# Patient Record
Sex: Female | Born: 1965 | Race: Black or African American | Hispanic: No | Marital: Married | Smoking: Never smoker
Health system: Southern US, Community
[De-identification: ages and names within clinical notes are randomized; demographics above are authoritative.]

## PROBLEM LIST (undated history)

## (undated) DIAGNOSIS — E119 Type 2 diabetes mellitus without complications: Secondary | ICD-10-CM

## (undated) DIAGNOSIS — J9610 Chronic respiratory failure, unspecified whether with hypoxia or hypercapnia: Secondary | ICD-10-CM

## (undated) DIAGNOSIS — I10 Essential (primary) hypertension: Secondary | ICD-10-CM

## (undated) DIAGNOSIS — C9210 Chronic myeloid leukemia, BCR/ABL-positive, not having achieved remission: Secondary | ICD-10-CM

## (undated) DIAGNOSIS — E669 Obesity, unspecified: Secondary | ICD-10-CM

## (undated) DIAGNOSIS — R011 Cardiac murmur, unspecified: Secondary | ICD-10-CM

## (undated) DIAGNOSIS — I5032 Chronic diastolic (congestive) heart failure: Secondary | ICD-10-CM

## (undated) DIAGNOSIS — G4733 Obstructive sleep apnea (adult) (pediatric): Secondary | ICD-10-CM

## (undated) HISTORY — DX: Chronic respiratory failure, unspecified whether with hypoxia or hypercapnia: J96.10

## (undated) HISTORY — DX: Type 2 diabetes mellitus without complications: E11.9

## (undated) HISTORY — PX: PARTIAL HYSTERECTOMY: SHX80

## (undated) HISTORY — DX: Cardiac murmur, unspecified: R01.1

## (undated) HISTORY — PX: ABDOMINAL HYSTERECTOMY: SHX81

---

## 2005-08-07 ENCOUNTER — Inpatient Hospital Stay: Payer: Self-pay | Admitting: Internal Medicine

## 2005-08-07 ENCOUNTER — Other Ambulatory Visit: Payer: Self-pay

## 2005-08-08 ENCOUNTER — Other Ambulatory Visit: Payer: Self-pay

## 2006-05-09 ENCOUNTER — Other Ambulatory Visit: Payer: Self-pay

## 2006-05-10 ENCOUNTER — Inpatient Hospital Stay: Payer: Self-pay | Admitting: Internal Medicine

## 2006-09-12 ENCOUNTER — Emergency Department: Payer: Self-pay | Admitting: Emergency Medicine

## 2007-07-20 ENCOUNTER — Ambulatory Visit: Payer: Self-pay | Admitting: Family Medicine

## 2010-04-15 ENCOUNTER — Ambulatory Visit: Payer: Self-pay | Admitting: Internal Medicine

## 2011-02-05 ENCOUNTER — Ambulatory Visit: Payer: Self-pay | Admitting: Oncology

## 2011-02-12 ENCOUNTER — Ambulatory Visit: Payer: Self-pay | Admitting: Oncology

## 2011-02-12 LAB — COMPREHENSIVE METABOLIC PANEL
Anion Gap: 10 (ref 7–16)
Calcium, Total: 9.1 mg/dL (ref 8.5–10.1)
Chloride: 103 mmol/L (ref 98–107)
EGFR (African American): >60
Glucose: 101 mg/dL — ABNORMAL HIGH (ref 65–99)
Osmolality: 282 (ref 275–301)
Potassium: 3.7 mmol/L (ref 3.5–5.1)
SGOT(AST): 21 U/L (ref 15–37)
SGPT (ALT): 31 U/L

## 2011-02-12 LAB — CBC CANCER CENTER
Basophil %: 0.4 %
Eosinophil #: 0.3 x10 3/mm (ref 0.0–0.7)
HCT: 36.3 % (ref 35.0–47.0)
Lymphocyte %: 25.7 %
MCHC: 33.7 g/dL (ref 32.0–36.0)
MCV: 82 fL (ref 80–100)
Monocyte #: 0.9 x10 3/mm — ABNORMAL HIGH (ref 0.0–0.7)
Neutrophil #: 5.2 x10 3/mm (ref 1.4–6.5)
Platelet: 165 x10 3/mm (ref 150–440)
RDW: 15.1 % — ABNORMAL HIGH (ref 11.5–14.5)
WBC: 8.7 x10 3/mm (ref 3.6–11.0)

## 2011-03-12 ENCOUNTER — Ambulatory Visit: Payer: Self-pay | Admitting: Oncology

## 2011-04-30 ENCOUNTER — Ambulatory Visit: Payer: Self-pay | Admitting: Internal Medicine

## 2011-06-03 ENCOUNTER — Ambulatory Visit: Payer: Self-pay | Admitting: Oncology

## 2011-06-03 LAB — COMPREHENSIVE METABOLIC PANEL
Albumin: 3.6 g/dL (ref 3.4–5.0)
BUN: 15 mg/dL (ref 7–18)
Calcium, Total: 8.9 mg/dL (ref 8.5–10.1)
Chloride: 102 mmol/L (ref 98–107)
Co2: 25 mmol/L (ref 21–32)
Creatinine: 0.83 mg/dL (ref 0.60–1.30)
EGFR (Non-African Amer.): >60
Osmolality: 282 (ref 275–301)
Potassium: 3.7 mmol/L (ref 3.5–5.1)
SGOT(AST): 16 U/L (ref 15–37)
SGPT (ALT): 27 U/L
Total Protein: 8.1 g/dL (ref 6.4–8.2)

## 2011-06-03 LAB — CBC CANCER CENTER
Eosinophil #: 0.6 x10 3/mm (ref 0.0–0.7)
Eosinophil %: 7 %
HCT: 36.2 % (ref 35.0–47.0)
Lymphocyte %: 30.2 %
MCH: 26 pg (ref 26.0–34.0)
MCHC: 31.8 g/dL — ABNORMAL LOW (ref 32.0–36.0)
MCV: 82 fL (ref 80–100)
Monocyte %: 11.7 %
Platelet: 165 x10 3/mm (ref 150–440)
RBC: 4.42 10*6/uL (ref 3.80–5.20)
WBC: 8.4 x10 3/mm (ref 3.6–11.0)

## 2011-06-09 ENCOUNTER — Ambulatory Visit: Payer: Self-pay | Admitting: Oncology

## 2011-06-24 ENCOUNTER — Ambulatory Visit: Payer: Self-pay | Admitting: Internal Medicine

## 2011-09-02 ENCOUNTER — Ambulatory Visit: Payer: Self-pay | Admitting: Oncology

## 2011-09-02 LAB — CBC CANCER CENTER
Basophil #: 0.1 x10 3/mm (ref 0.0–0.1)
Eosinophil %: 5.9 %
HCT: 35.7 % (ref 35.0–47.0)
HGB: 11.7 g/dL — ABNORMAL LOW (ref 12.0–16.0)
Lymphocyte #: 2.1 x10 3/mm (ref 1.0–3.6)
MCHC: 32.7 g/dL (ref 32.0–36.0)
MCV: 80 fL (ref 80–100)
Monocyte #: 1.1 x10 3/mm — ABNORMAL HIGH (ref 0.2–0.9)
Platelet: 149 x10 3/mm — ABNORMAL LOW (ref 150–440)
RDW: 14.4 % (ref 11.5–14.5)
WBC: 8.6 x10 3/mm (ref 3.6–11.0)

## 2011-09-09 ENCOUNTER — Ambulatory Visit: Payer: Self-pay | Admitting: Oncology

## 2011-12-02 ENCOUNTER — Ambulatory Visit: Payer: Self-pay | Admitting: Oncology

## 2011-12-02 LAB — CBC CANCER CENTER
Basophil #: 0.1 x10 3/mm (ref 0.0–0.1)
Eosinophil #: 0.5 x10 3/mm (ref 0.0–0.7)
HCT: 33.7 % — ABNORMAL LOW (ref 35.0–47.0)
HGB: 10.8 g/dL — ABNORMAL LOW (ref 12.0–16.0)
Lymphocyte %: 22.6 %
MCH: 25.5 pg — ABNORMAL LOW (ref 26.0–34.0)
MCHC: 31.9 g/dL — ABNORMAL LOW (ref 32.0–36.0)
Monocyte #: 1 x10 3/mm — ABNORMAL HIGH (ref 0.2–0.9)
Neutrophil %: 58.2 %
Platelet: 152 x10 3/mm (ref 150–440)
RDW: 16.6 % — ABNORMAL HIGH (ref 11.5–14.5)

## 2011-12-10 ENCOUNTER — Ambulatory Visit: Payer: Self-pay | Admitting: Oncology

## 2012-02-09 ENCOUNTER — Ambulatory Visit: Payer: Self-pay | Admitting: Oncology

## 2012-05-31 ENCOUNTER — Ambulatory Visit: Payer: Self-pay | Admitting: Oncology

## 2012-06-01 LAB — CBC CANCER CENTER
Basophil #: 0.1 x10 3/mm (ref 0.0–0.1)
Basophil %: 1.1 %
Eosinophil #: 0.4 x10 3/mm (ref 0.0–0.7)
Eosinophil %: 5.2 %
HCT: 35.4 % (ref 35.0–47.0)
MCHC: 32.5 g/dL (ref 32.0–36.0)
MCV: 80 fL (ref 80–100)
Neutrophil #: 4 x10 3/mm (ref 1.4–6.5)
Platelet: 153 x10 3/mm (ref 150–440)
RBC: 4.45 10*6/uL (ref 3.80–5.20)
RDW: 15.7 % — ABNORMAL HIGH (ref 11.5–14.5)
WBC: 7.7 x10 3/mm (ref 3.6–11.0)

## 2012-06-08 ENCOUNTER — Ambulatory Visit: Payer: Self-pay | Admitting: Oncology

## 2012-09-08 ENCOUNTER — Ambulatory Visit: Payer: Self-pay | Admitting: Oncology

## 2012-09-25 ENCOUNTER — Inpatient Hospital Stay: Payer: Self-pay | Admitting: Internal Medicine

## 2012-09-25 LAB — URINALYSIS, COMPLETE
Blood: NEGATIVE
Glucose,UR: >=500 mg/dL (ref 0–75)
Ketone: NEGATIVE
Leukocyte Esterase: NEGATIVE
Ph: 5 (ref 4.5–8.0)
Protein: NEGATIVE
RBC,UR: 2 /HPF (ref 0–5)
WBC UR: 2 /HPF (ref 0–5)

## 2012-09-25 LAB — CBC
HCT: 36 % (ref 35.0–47.0)
HGB: 12 g/dL (ref 12.0–16.0)
MCH: 25.4 pg — ABNORMAL LOW (ref 26.0–34.0)
MCHC: 33.4 g/dL (ref 32.0–36.0)
Platelet: 186 10*3/uL (ref 150–440)
RBC: 4.74 10*6/uL (ref 3.80–5.20)
WBC: 11.6 10*3/uL — ABNORMAL HIGH (ref 3.6–11.0)

## 2012-09-25 LAB — COMPREHENSIVE METABOLIC PANEL
Alkaline Phosphatase: 117 U/L (ref 50–136)
Anion Gap: 6 — ABNORMAL LOW (ref 7–16)
Bilirubin,Total: 1.1 mg/dL — ABNORMAL HIGH (ref 0.2–1.0)
Calcium, Total: 9.3 mg/dL (ref 8.5–10.1)
Co2: 26 mmol/L (ref 21–32)
Creatinine: 0.81 mg/dL (ref 0.60–1.30)
EGFR (African American): >60
EGFR (Non-African Amer.): >60
Glucose: 248 mg/dL — ABNORMAL HIGH (ref 65–99)
Osmolality: 278 (ref 275–301)
Potassium: 3.5 mmol/L (ref 3.5–5.1)
SGPT (ALT): 29 U/L (ref 12–78)
Total Protein: 8.1 g/dL (ref 6.4–8.2)

## 2012-09-26 LAB — LIPASE, BLOOD: Lipase: >10000 U/L — ABNORMAL HIGH (ref 73–393)

## 2012-09-26 LAB — HEMATOCRIT: HCT: 33.9 % — ABNORMAL LOW

## 2012-09-27 LAB — CBC WITH DIFFERENTIAL/PLATELET
Basophil #: 0.1 10*3/uL (ref 0.0–0.1)
Eosinophil %: 1.6 %
HCT: 34.8 % — ABNORMAL LOW (ref 35.0–47.0)
Lymphocyte #: 1.8 10*3/uL (ref 1.0–3.6)
MCH: 25.5 pg — ABNORMAL LOW (ref 26.0–34.0)
MCHC: 33.7 g/dL (ref 32.0–36.0)
MCV: 76 fL — ABNORMAL LOW (ref 80–100)
Monocyte %: 12.7 %
Neutrophil #: 8.8 10*3/uL — ABNORMAL HIGH (ref 1.4–6.5)
Neutrophil %: 71 %
Platelet: 187 10*3/uL (ref 150–440)
RBC: 4.6 10*6/uL (ref 3.80–5.20)
WBC: 12.4 10*3/uL — ABNORMAL HIGH (ref 3.6–11.0)

## 2012-09-27 LAB — COMPREHENSIVE METABOLIC PANEL
Albumin: 3.1 g/dL — ABNORMAL LOW (ref 3.4–5.0)
Alkaline Phosphatase: 103 U/L (ref 50–136)
Anion Gap: 10 (ref 7–16)
Bilirubin,Total: 0.9 mg/dL (ref 0.2–1.0)
Co2: 24 mmol/L (ref 21–32)
Creatinine: 0.71 mg/dL (ref 0.60–1.30)
EGFR (African American): >60
EGFR (Non-African Amer.): >60
Osmolality: 270 (ref 275–301)
Potassium: 3.5 mmol/L (ref 3.5–5.1)
SGOT(AST): 17 U/L (ref 15–37)
Sodium: 134 mmol/L — ABNORMAL LOW (ref 136–145)
Total Protein: 7.6 g/dL (ref 6.4–8.2)

## 2012-09-27 LAB — LIPID PANEL
Cholesterol: 165 mg/dL (ref 0–200)
HDL Cholesterol: 70 mg/dL — ABNORMAL HIGH (ref 40–60)
Ldl Cholesterol, Calc: 78 mg/dL (ref 0–100)
VLDL Cholesterol, Calc: 17 mg/dL (ref 5–40)

## 2012-09-27 LAB — LIPASE, BLOOD: Lipase: 2484 U/L — ABNORMAL HIGH (ref 73–393)

## 2012-09-28 LAB — CBC WITH DIFFERENTIAL/PLATELET
Basophil #: 0.1 10*3/uL (ref 0.0–0.1)
Basophil %: 0.5 %
Eosinophil #: 0.3 10*3/uL (ref 0.0–0.7)
Eosinophil %: 2.4 %
HGB: 11.3 g/dL — ABNORMAL LOW (ref 12.0–16.0)
Lymphocyte #: 1.8 10*3/uL (ref 1.0–3.6)
Lymphocyte %: 15.5 %
MCH: 25.6 pg — ABNORMAL LOW (ref 26.0–34.0)
MCV: 76 fL — ABNORMAL LOW (ref 80–100)
Monocyte %: 14.8 %
Neutrophil #: 7.7 10*3/uL — ABNORMAL HIGH (ref 1.4–6.5)
Platelet: 182 10*3/uL (ref 150–440)
RBC: 4.43 10*6/uL (ref 3.80–5.20)
WBC: 11.5 10*3/uL — ABNORMAL HIGH (ref 3.6–11.0)

## 2012-09-28 LAB — COMPREHENSIVE METABOLIC PANEL
Albumin: 2.7 g/dL — ABNORMAL LOW (ref 3.4–5.0)
Alkaline Phosphatase: 125 U/L (ref 50–136)
Anion Gap: 12 (ref 7–16)
Bilirubin,Total: 1.4 mg/dL — ABNORMAL HIGH (ref 0.2–1.0)
Calcium, Total: 9 mg/dL (ref 8.5–10.1)
Co2: 20 mmol/L — ABNORMAL LOW (ref 21–32)
EGFR (African American): >60
EGFR (Non-African Amer.): >60
Glucose: 133 mg/dL — ABNORMAL HIGH (ref 65–99)
Potassium: 3.3 mmol/L — ABNORMAL LOW (ref 3.5–5.1)
SGPT (ALT): 22 U/L (ref 12–78)

## 2012-09-29 ENCOUNTER — Ambulatory Visit: Payer: Self-pay | Admitting: Oncology

## 2012-09-29 LAB — URINALYSIS, COMPLETE
Bilirubin,UR: NEGATIVE
Glucose,UR: NEGATIVE mg/dL (ref 0–75)
Ph: 5 (ref 4.5–8.0)
Protein: 100
Squamous Epithelial: 1
WBC UR: 51 /HPF (ref 0–5)

## 2012-09-29 LAB — CBC WITH DIFFERENTIAL/PLATELET
Basophil #: 0.1 10*3/uL (ref 0.0–0.1)
Basophil %: 0.9 %
Eosinophil #: 0.3 10*3/uL (ref 0.0–0.7)
Eosinophil %: 3 %
HGB: 11.4 g/dL — ABNORMAL LOW (ref 12.0–16.0)
Lymphocyte %: 21.8 %
MCHC: 33.5 g/dL (ref 32.0–36.0)
Monocyte %: 15.7 %
Neutrophil %: 58.6 %
Platelet: 199 10*3/uL (ref 150–440)
RDW: 15.9 % — ABNORMAL HIGH (ref 11.5–14.5)
WBC: 10.4 10*3/uL (ref 3.6–11.0)

## 2012-09-29 LAB — COMPREHENSIVE METABOLIC PANEL
Albumin: 2.6 g/dL — ABNORMAL LOW (ref 3.4–5.0)
Anion Gap: 10 (ref 7–16)
BUN: 9 mg/dL (ref 7–18)
Bilirubin,Total: 0.9 mg/dL (ref 0.2–1.0)
Calcium, Total: 9.1 mg/dL (ref 8.5–10.1)
Chloride: 105 mmol/L (ref 98–107)
Co2: 21 mmol/L (ref 21–32)
Creatinine: 0.67 mg/dL (ref 0.60–1.30)
EGFR (Non-African Amer.): >60
Glucose: 155 mg/dL — ABNORMAL HIGH (ref 65–99)
Osmolality: 274 (ref 275–301)
Potassium: 3.5 mmol/L (ref 3.5–5.1)
SGPT (ALT): 22 U/L (ref 12–78)
Sodium: 136 mmol/L (ref 136–145)
Total Protein: 7.4 g/dL (ref 6.4–8.2)

## 2012-09-29 LAB — LIPASE, BLOOD: Lipase: 339 U/L (ref 73–393)

## 2012-10-09 ENCOUNTER — Ambulatory Visit: Payer: Self-pay | Admitting: Oncology

## 2012-10-17 ENCOUNTER — Ambulatory Visit: Payer: Self-pay | Admitting: Oncology

## 2012-10-17 LAB — COMPREHENSIVE METABOLIC PANEL
Albumin: 3.4 g/dL (ref 3.4–5.0)
Anion Gap: 10 (ref 7–16)
Bilirubin,Total: 0.4 mg/dL (ref 0.2–1.0)
Calcium, Total: 9.3 mg/dL (ref 8.5–10.1)
EGFR (African American): >60
EGFR (Non-African Amer.): >60
Glucose: 163 mg/dL — ABNORMAL HIGH (ref 65–99)
Osmolality: 283 (ref 275–301)
Potassium: 3.3 mmol/L — ABNORMAL LOW (ref 3.5–5.1)
SGOT(AST): 14 U/L — ABNORMAL LOW (ref 15–37)
SGPT (ALT): 23 U/L (ref 12–78)
Sodium: 140 mmol/L (ref 136–145)

## 2012-10-17 LAB — CBC CANCER CENTER
Eosinophil %: 5.7 %
HCT: 32.6 % — ABNORMAL LOW (ref 35.0–47.0)
HGB: 10.8 g/dL — ABNORMAL LOW (ref 12.0–16.0)
Lymphocyte %: 31.6 %
MCV: 77 fL — ABNORMAL LOW (ref 80–100)
Monocyte #: 0.8 x10 3/mm (ref 0.2–0.9)
Monocyte %: 11.1 %
Neutrophil #: 3.5 x10 3/mm (ref 1.4–6.5)
RBC: 4.21 10*6/uL (ref 3.80–5.20)
RDW: 16.5 % — ABNORMAL HIGH (ref 11.5–14.5)
WBC: 6.9 x10 3/mm (ref 3.6–11.0)

## 2012-10-30 ENCOUNTER — Emergency Department: Payer: Self-pay | Admitting: Emergency Medicine

## 2012-10-30 LAB — CBC WITH DIFFERENTIAL/PLATELET
Basophil #: 0.1 10*3/uL (ref 0.0–0.1)
Eosinophil #: 0.5 10*3/uL (ref 0.0–0.7)
Eosinophil %: 4.9 %
Lymphocyte %: 26.8 %
MCV: 77 fL — ABNORMAL LOW (ref 80–100)
Monocyte #: 1 x10 3/mm — ABNORMAL HIGH (ref 0.2–0.9)
Platelet: 193 10*3/uL (ref 150–440)
RBC: 4.64 10*6/uL (ref 3.80–5.20)
RDW: 16.4 % — ABNORMAL HIGH (ref 11.5–14.5)

## 2012-10-30 LAB — COMPREHENSIVE METABOLIC PANEL
Albumin: 3.7 g/dL (ref 3.4–5.0)
Anion Gap: 7 (ref 7–16)
BUN: 13 mg/dL (ref 7–18)
Bilirubin,Total: 0.5 mg/dL (ref 0.2–1.0)
Calcium, Total: 9.4 mg/dL (ref 8.5–10.1)
Creatinine: 0.85 mg/dL (ref 0.60–1.30)
EGFR (African American): >60
EGFR (Non-African Amer.): >60
Glucose: 117 mg/dL — ABNORMAL HIGH (ref 65–99)
Potassium: 3.5 mmol/L (ref 3.5–5.1)
SGOT(AST): 22 U/L (ref 15–37)

## 2012-11-08 ENCOUNTER — Ambulatory Visit: Payer: Self-pay | Admitting: Oncology

## 2012-11-21 ENCOUNTER — Ambulatory Visit: Payer: Self-pay | Admitting: Internal Medicine

## 2012-12-09 ENCOUNTER — Ambulatory Visit: Payer: Self-pay | Admitting: Oncology

## 2012-12-12 LAB — COMPREHENSIVE METABOLIC PANEL
Alkaline Phosphatase: 105 U/L (ref 50–136)
Chloride: 104 mmol/L (ref 98–107)
Co2: 25 mmol/L (ref 21–32)
EGFR (African American): >60
EGFR (Non-African Amer.): >60
Glucose: 149 mg/dL — ABNORMAL HIGH (ref 65–99)
Osmolality: 284 (ref 275–301)
Potassium: 3.6 mmol/L (ref 3.5–5.1)
SGOT(AST): 13 U/L — ABNORMAL LOW (ref 15–37)
SGPT (ALT): 24 U/L (ref 12–78)
Sodium: 140 mmol/L (ref 136–145)

## 2012-12-12 LAB — CBC CANCER CENTER
Basophil #: 0.1 x10 3/mm (ref 0.0–0.1)
Basophil %: 1.1 %
Eosinophil #: 0.4 x10 3/mm (ref 0.0–0.7)
Eosinophil %: 5.1 %
HCT: 33.2 % — ABNORMAL LOW (ref 35.0–47.0)
Lymphocyte #: 2.6 x10 3/mm (ref 1.0–3.6)
Lymphocyte %: 33.8 %
MCH: 24.3 pg — ABNORMAL LOW (ref 26.0–34.0)
MCHC: 31.8 g/dL — ABNORMAL LOW (ref 32.0–36.0)
MCV: 76 fL — ABNORMAL LOW (ref 80–100)
Monocyte %: 13.1 %
Neutrophil #: 3.5 x10 3/mm (ref 1.4–6.5)
Neutrophil %: 46.9 %
Platelet: 191 x10 3/mm (ref 150–440)
RBC: 4.34 10*6/uL (ref 3.80–5.20)
RDW: 15.9 % — ABNORMAL HIGH (ref 11.5–14.5)

## 2012-12-13 ENCOUNTER — Ambulatory Visit: Payer: Self-pay | Admitting: Internal Medicine

## 2012-12-19 ENCOUNTER — Ambulatory Visit: Payer: Self-pay | Admitting: Internal Medicine

## 2012-12-28 ENCOUNTER — Ambulatory Visit: Payer: Self-pay | Admitting: Internal Medicine

## 2013-01-08 ENCOUNTER — Ambulatory Visit: Payer: Self-pay | Admitting: Oncology

## 2013-03-12 ENCOUNTER — Ambulatory Visit: Payer: Self-pay | Admitting: Oncology

## 2013-03-20 LAB — COMPREHENSIVE METABOLIC PANEL
ANION GAP: 11 (ref 7–16)
Albumin: 3.8 g/dL (ref 3.4–5.0)
Alkaline Phosphatase: 100 U/L
BUN: 27 mg/dL — ABNORMAL HIGH (ref 7–18)
Bilirubin,Total: 0.3 mg/dL (ref 0.2–1.0)
CALCIUM: 8.4 mg/dL — AB (ref 8.5–10.1)
CHLORIDE: 104 mmol/L (ref 98–107)
Co2: 25 mmol/L (ref 21–32)
Creatinine: 1.49 mg/dL — ABNORMAL HIGH (ref 0.60–1.30)
EGFR (African American): 48 — ABNORMAL LOW
EGFR (Non-African Amer.): 42 — ABNORMAL LOW
Glucose: 117 mg/dL — ABNORMAL HIGH (ref 65–99)
Osmolality: 286 (ref 275–301)
Potassium: 3.8 mmol/L (ref 3.5–5.1)
SGOT(AST): 19 U/L (ref 15–37)
SGPT (ALT): 34 U/L (ref 12–78)
Sodium: 140 mmol/L (ref 136–145)
Total Protein: 7.5 g/dL (ref 6.4–8.2)

## 2013-03-20 LAB — CBC CANCER CENTER
Basophil #: 0 x10 3/mm (ref 0.0–0.1)
Basophil %: 0.5 %
EOS ABS: 0.4 x10 3/mm (ref 0.0–0.7)
EOS PCT: 5.3 %
HCT: 33.3 % — ABNORMAL LOW (ref 35.0–47.0)
HGB: 10.5 g/dL — ABNORMAL LOW (ref 12.0–16.0)
LYMPHS ABS: 1.9 x10 3/mm (ref 1.0–3.6)
LYMPHS PCT: 26.9 %
MCH: 24.6 pg — ABNORMAL LOW (ref 26.0–34.0)
MCHC: 31.5 g/dL — ABNORMAL LOW (ref 32.0–36.0)
MCV: 78 fL — ABNORMAL LOW (ref 80–100)
Monocyte #: 1.1 x10 3/mm — ABNORMAL HIGH (ref 0.2–0.9)
Monocyte %: 16.1 %
NEUTROS ABS: 3.7 x10 3/mm (ref 1.4–6.5)
Neutrophil %: 51.2 %
PLATELETS: 163 x10 3/mm (ref 150–440)
RBC: 4.27 10*6/uL (ref 3.80–5.20)
RDW: 17.7 % — AB (ref 11.5–14.5)
WBC: 7.1 x10 3/mm (ref 3.6–11.0)

## 2013-03-20 LAB — PHOSPHORUS: PHOSPHORUS: 3.8 mg/dL (ref 2.5–4.9)

## 2013-03-20 LAB — MAGNESIUM: MAGNESIUM: 2.2 mg/dL

## 2013-04-08 ENCOUNTER — Ambulatory Visit: Payer: Self-pay | Admitting: Oncology

## 2013-06-19 ENCOUNTER — Ambulatory Visit: Payer: Self-pay | Admitting: Oncology

## 2013-06-25 LAB — CBC CANCER CENTER
BASOS PCT: 0.6 %
Basophil #: 0 x10 3/mm (ref 0.0–0.1)
EOS ABS: 0.3 x10 3/mm (ref 0.0–0.7)
EOS PCT: 4.1 %
HCT: 32.4 % — ABNORMAL LOW (ref 35.0–47.0)
HGB: 10.4 g/dL — ABNORMAL LOW (ref 12.0–16.0)
LYMPHS PCT: 22.9 %
Lymphocyte #: 1.9 x10 3/mm (ref 1.0–3.6)
MCH: 26 pg (ref 26.0–34.0)
MCHC: 32.2 g/dL (ref 32.0–36.0)
MCV: 81 fL (ref 80–100)
Monocyte #: 0.9 x10 3/mm (ref 0.2–0.9)
Monocyte %: 10.3 %
NEUTROS PCT: 62.1 %
Neutrophil #: 5.1 x10 3/mm (ref 1.4–6.5)
Platelet: 219 x10 3/mm (ref 150–440)
RBC: 4.02 10*6/uL (ref 3.80–5.20)
RDW: 17.1 % — AB (ref 11.5–14.5)
WBC: 8.3 x10 3/mm (ref 3.6–11.0)

## 2013-06-25 LAB — COMPREHENSIVE METABOLIC PANEL
ALT: 34 U/L (ref 12–78)
Albumin: 3.6 g/dL (ref 3.4–5.0)
Alkaline Phosphatase: 111 U/L
Anion Gap: 12 (ref 7–16)
BUN: 16 mg/dL (ref 7–18)
Bilirubin,Total: 0.3 mg/dL (ref 0.2–1.0)
CREATININE: 1.21 mg/dL (ref 0.60–1.30)
Calcium, Total: 9 mg/dL (ref 8.5–10.1)
Chloride: 105 mmol/L (ref 98–107)
Co2: 24 mmol/L (ref 21–32)
EGFR (Non-African Amer.): 54 — ABNORMAL LOW
GLUCOSE: 139 mg/dL — AB (ref 65–99)
Osmolality: 285 (ref 275–301)
Potassium: 3.9 mmol/L (ref 3.5–5.1)
SGOT(AST): 15 U/L (ref 15–37)
Sodium: 141 mmol/L (ref 136–145)
Total Protein: 7.5 g/dL (ref 6.4–8.2)

## 2013-07-09 ENCOUNTER — Ambulatory Visit: Payer: Self-pay | Admitting: Oncology

## 2013-10-18 ENCOUNTER — Ambulatory Visit: Payer: Self-pay | Admitting: Oncology

## 2013-10-18 LAB — COMPREHENSIVE METABOLIC PANEL
ALBUMIN: 3.4 g/dL (ref 3.4–5.0)
ALT: 36 U/L
Alkaline Phosphatase: 128 U/L — ABNORMAL HIGH
Anion Gap: 11 (ref 7–16)
BUN: 16 mg/dL (ref 7–18)
Bilirubin,Total: 0.2 mg/dL (ref 0.2–1.0)
CALCIUM: 8.8 mg/dL (ref 8.5–10.1)
CO2: 22 mmol/L (ref 21–32)
CREATININE: 1.31 mg/dL — AB (ref 0.60–1.30)
Chloride: 104 mmol/L (ref 98–107)
EGFR (African American): 56 — ABNORMAL LOW
EGFR (Non-African Amer.): 49 — ABNORMAL LOW
Glucose: 109 mg/dL — ABNORMAL HIGH (ref 65–99)
Osmolality: 283 (ref 275–301)
Potassium: 3.6 mmol/L (ref 3.5–5.1)
SGOT(AST): 18 U/L (ref 15–37)
Sodium: 141 mmol/L (ref 136–145)
Total Protein: 7.5 g/dL (ref 6.4–8.2)

## 2013-10-18 LAB — CBC CANCER CENTER
BASOS PCT: 0.6 %
Basophil #: 0 x10 3/mm (ref 0.0–0.1)
EOS ABS: 0.3 x10 3/mm (ref 0.0–0.7)
Eosinophil %: 5 %
HCT: 35.1 % (ref 35.0–47.0)
HGB: 11.3 g/dL — ABNORMAL LOW (ref 12.0–16.0)
LYMPHS ABS: 1.3 x10 3/mm (ref 1.0–3.6)
Lymphocyte %: 22.1 %
MCH: 25.2 pg — ABNORMAL LOW (ref 26.0–34.0)
MCHC: 32.3 g/dL (ref 32.0–36.0)
MCV: 78 fL — AB (ref 80–100)
Monocyte #: 1.2 x10 3/mm — ABNORMAL HIGH (ref 0.2–0.9)
Monocyte %: 20.6 %
Neutrophil #: 2.9 x10 3/mm (ref 1.4–6.5)
Neutrophil %: 51.7 %
Platelet: 163 x10 3/mm (ref 150–440)
RBC: 4.5 10*6/uL (ref 3.80–5.20)
RDW: 16.1 % — AB (ref 11.5–14.5)
WBC: 5.7 x10 3/mm (ref 3.6–11.0)

## 2013-10-18 LAB — MAGNESIUM: Magnesium: 1.7 mg/dL — ABNORMAL LOW

## 2013-10-18 LAB — PHOSPHORUS: Phosphorus: 3.4 mg/dL (ref 2.5–4.9)

## 2013-11-08 ENCOUNTER — Ambulatory Visit: Payer: Self-pay | Admitting: Oncology

## 2013-12-25 ENCOUNTER — Ambulatory Visit: Payer: Self-pay | Admitting: Internal Medicine

## 2014-01-17 ENCOUNTER — Ambulatory Visit: Payer: Self-pay | Admitting: Oncology

## 2014-01-17 LAB — COMPREHENSIVE METABOLIC PANEL
ALBUMIN: 3.7 g/dL (ref 3.4–5.0)
AST: 18 U/L (ref 15–37)
Alkaline Phosphatase: 114 U/L
Anion Gap: 10 (ref 7–16)
BUN: 14 mg/dL (ref 7–18)
Bilirubin,Total: 0.4 mg/dL (ref 0.2–1.0)
CHLORIDE: 104 mmol/L (ref 98–107)
CO2: 26 mmol/L (ref 21–32)
Calcium, Total: 9.1 mg/dL (ref 8.5–10.1)
Creatinine: 0.99 mg/dL (ref 0.60–1.30)
EGFR (Non-African Amer.): >60
Glucose: 101 mg/dL — ABNORMAL HIGH (ref 65–99)
Osmolality: 280 (ref 275–301)
POTASSIUM: 3.8 mmol/L (ref 3.5–5.1)
SGPT (ALT): 44 U/L
SODIUM: 140 mmol/L (ref 136–145)
TOTAL PROTEIN: 7.4 g/dL (ref 6.4–8.2)

## 2014-01-17 LAB — CBC CANCER CENTER
Basophil #: 0.1 x10 3/mm (ref 0.0–0.1)
Basophil %: 0.7 %
EOS ABS: 0.3 x10 3/mm (ref 0.0–0.7)
EOS PCT: 4.4 %
HCT: 37.4 % (ref 35.0–47.0)
HGB: 12.1 g/dL (ref 12.0–16.0)
LYMPHS ABS: 2.4 x10 3/mm (ref 1.0–3.6)
LYMPHS PCT: 32.8 %
MCH: 25.9 pg — ABNORMAL LOW (ref 26.0–34.0)
MCHC: 32.4 g/dL (ref 32.0–36.0)
MCV: 80 fL (ref 80–100)
Monocyte #: 1 x10 3/mm — ABNORMAL HIGH (ref 0.2–0.9)
Monocyte %: 13.3 %
Neutrophil #: 3.6 x10 3/mm (ref 1.4–6.5)
Neutrophil %: 48.8 %
PLATELETS: 164 x10 3/mm (ref 150–440)
RBC: 4.67 10*6/uL (ref 3.80–5.20)
RDW: 16.3 % — ABNORMAL HIGH (ref 11.5–14.5)
WBC: 7.4 x10 3/mm (ref 3.6–11.0)

## 2014-01-17 LAB — PHOSPHORUS: Phosphorus: 3.6 mg/dL (ref 2.5–4.9)

## 2014-01-17 LAB — MAGNESIUM: Magnesium: 1.8 mg/dL

## 2014-02-08 ENCOUNTER — Ambulatory Visit: Payer: Self-pay | Admitting: Oncology

## 2014-05-31 NOTE — Consult Note (Signed)
PATIENT NAME:  Cassidy Gates, Cassidy Gates MR#:  [id number] DATE OF BIRTH:  [birth year]  DATE OF CONSULTATION:  09/27/2012  REFERRING PHYSICIAN:  Wilfred Curtis, MD CONSULTING PHYSICIAN:  Andria Meuse, NP  PRIMARY CARE PHYSICIAN:  Augustina Mood, MD  ONCOLOGIST: Delight Hoh, MD  GASTROENTEROLOGIST: Lucilla Lame, MD  REASON FOR CONSULTATION: Acute pancreatitis.   HISTORY OF PRESENT ILLNESS: Cassidy Gates is a pleasant 49 year old black female who was admitted with pancreatitis. She reports a 2-day history of severe epigastric pain prior to admission. She describes the pain as a "gas pain and pressure." She has never had pancreatitis before. The pain is in her upper abdomen and radiates to her back.  It is currently 6 out of 10 on pain scale. The pain is constant but was worse with eating. She denies any alcohol use or illicit drug use. She has a history of hypercholesterolemia. She is on Tasigna for CML daily. She has been on this for the last 5 years. She had an episode of nausea and vomiting with a large amount of blood prior to admission. She describes the blood as bright red. She has had no further hematemesis. CT shows a mild pancreatitis. Her lipase was greater than 10,000 upon admission and is now 2484. Ultrasound did not show cholelithiasis, her common bile duct was 5.1 cm. She does have hepatomegaly with a 20.5 cm liver. Her white blood cell count is 12.4, hemoglobin 11.7. LFTs are normal except for an albumin of 3.1.   PAST MEDICAL AND SURGICAL HISTORY: CML followed by Dr. Grayland Ormond, diabetes mellitus type 2, hypertension, cardiomyopathy with congestive heart failure, hypercholesterolemia, hysterectomy with unilateral salpingo-oophorectomy.   MEDICATIONS PRIOR TO ADMISSION: Cyclobenzaprine 10 mg t.i.d., Diovan/HCT 160 mg/25 mg daily, lovastatin 40 mg daily, metformin 500 mg daily, metoprolol 25 mg b.i.d., montelukast 10 mg at bedtime, ranitidine 300 mg b.i.d., Tasigna 150 mg 2 capsules every 12 hours,  zolpidem 10 mg at bedtime.   ALLERGIES: CODEINE AND PENICILLIN.   FAMILY HISTORY: There is no known family history of pancreatic problems, liver or chronic GI problems. Mother deceased with breast cancer. Father had history of hypertension as well as sister with hypertension.   SOCIAL HISTORY: She is disabled but she does work as a Regulatory affairs officer for elderly. She is married. She has 1 healthy daughter. She denies any tobacco, alcohol or illicit drug use.   REVIEW OF SYSTEMS: See HPI, otherwise negative 12-point review of systems.   PHYSICAL EXAMINATION VITAL SIGNS:  Temp 98.1, pulse 78, respirations 18, blood pressure 172/81, O2 sat 94% on room air.  GENERAL: She is a well-developed, obese black female who is alert, oriented, pleasant and cooperative, in no acute distress.  HEENT: Sclerae clear, anicteric. Conjunctivae pink. Oropharynx pink and moist without any lesions.  NECK: Supple without mass or thyromegaly.  CHEST: Heart regular rate and rhythm. Normal S1, S2, without murmurs, clicks, rubs or gallops.  LUNGS: Clear to auscultation bilaterally.  ABDOMEN: Protuberant with positive bowel sounds x 4. Moderately distended. She has significant tenderness to her upper abdomen on deep palpation. There is no rebound tenderness or guarding. No hepatosplenomegaly or mass, although exam is limited given patient's body habitus.  EXTREMITIES: Without edema bilaterally.  SKIN: Warm and dry without any rash or jaundice.  MUSCULOSKELETAL: Good equal strength and movement bilaterally.  NEUROLOGIC: Grossly intact.  PSYCHIATRIC: She is alert with normal mood and affect.   LABORATORY STUDIES: Glucose 174, sodium 134, otherwise normal met-7. Lipase 2484. LFTs normal except albumin 3.1.  White blood cell count of 12.4, hemoglobin 11.7, hematocrit 34.8, platelets 187.   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 There is no history of alcohol use or cholelithiasis. Pancreatitis has been linked to Tasigna, which is being held, also lovastatin and Singulair carry a small risk. We will check for hypertriglyceridemia. Hematemesis may be secondary to peptic ulcer disease, gastritis or Mallory-Weiss tear.   PLAN 1.  N.p.o.  2.  Aggressive IV fluids.  3.  B.i.d. PPI IV.  4.  Agree with holding Tasigna, lovastatin and Singulair.  5.  Supportive measures including antiemetics and pain control.  6.  EGD with Dr. Allen Norris tomorrow. I discussed the procedure including risks and benefits which  include but are not limited to bleeding, infection, perforation, drug reaction. She agrees to the plan and consent has been obtained.   I would like to thank you for allowing [place] to participate in the care of Cassidy Gates.  ____________________________ Andria Meuse, NP klj:cs D: 09/27/2012 14:17:00 ET T: 09/27/2012 14:57:20 ET JOB#: [id number]  cc: Andria Meuse, NP, <Dictator> [name], MD Andria Meuse FNP ELECTRONICALLY SIGNED 10/23/2012 13:08

## 2014-05-31 NOTE — Consult Note (Signed)
Brief Consult Note: Diagnosis: pancreatitis.   Patient was seen by consultant.   Consult note dictated.   Comments: Cassidy Gates is a 49 y/o black female with hx DM, hyperlipidemia, CML on Tasigna for 5 yrs, htn & cardiomyopathy who presents with mild pancreatitis & hematemesis.  No ETOH or cholelithiasis.  Pancreatitis has been linked to [place] which is being held.  Also lovastatin & singulair carry risk.  Will check for hypertriglyceridemia.  Hematemesis may be secondary to PUD, gastritis or Mallory-weiss tear.  Plan: 1) NPO 2) Aggressive IVFS 3) BID PPI IV 4) Agree with holding Tasigna, lovastatin & singulair 5) Supportive measures including antiemetics/pain control 6) EGD with Dr Allen Norris tomorrow  Thanks for consult.  Please see full dictated note. #[id number].  Electronic Signatures: Andria Meuse (NP)  (Signed 20-Aug-14 14:06)  Authored: Brief Consult Note   Last Updated: 20-Aug-14 14:06 by Andria Meuse (NP)

## 2014-05-31 NOTE — Consult Note (Signed)
History of Present Illness:  Reason for Consult CML on Tasigna, now with pancreatitis.   HPI   Patient last seen in clinic in April 2014.  She is been on 300 mg Tasinga twice per day for 4-5 years with minimal side effects.  Over the last 2-3 days, patient states she had increasing abdominal pain as well as nausea vomiting.  She also has had minimal PO intake.  She otherwise feels well.  She denies any neurologic complaints.  She has no recent fevers.  She denies any chest pain or shortness of breath.  She denies any constipation or diarrhea.  She has no urinary complaints.  Patient feels generally terrible, but offers no further specific complaints.Marland Kitchen  PFSH:  Additional Past Medical and Surgical History Cancer history: August 2002 initiated on [place] but discontinued secondary to fluid retention.  She subsequently went on to receive dasatinib, but this was discontinued secondary to pleural effusion requiring chest tube.  March 2006 bone marrow biopsy revealed no evidence of CML and cytogenetics were reported as normal.  Patient was initiated on Tasigna 424mlligrams BID in 2009, this was decreased to her current dose of 300 mg secondary to alopecia.  Past medical history: CML, hypertension, diabetes, CHF, hypercholesterolemia.  Past surgical history: Hysterectomy with 1 ovary removed.  Family history: Negative and noncontributory.  Social history: Patient denies tobacco or alcohol.   Review of Systems:  Performance Status (ECOG) 0   Review of Systems   As per HPI. Otherwise, 10 point system review was negative.  NURSING NOTES: **Vital Signs.:   19-Aug-14 16:49   Vital Signs Type: Routine   Temperature Temperature (F): 98.5   Celsius: 36.9   Temperature Source: oral   Pulse Pulse: 71   Respirations Respirations: 20   Systolic BP Systolic BP: 1132  Diastolic BP (mmHg) Diastolic BP (mmHg): 92   Mean BP: 117   Pulse Ox % Pulse Ox %: 97   Pulse Ox Activity Level: At rest    Oxygen Delivery: Room Air/ 21 %   Physical Exam:  Physical Exam General: Well-developed, well-nourished, no acute distress. Eyes: Pink conjunctiva, anicteric sclera. HEENT: Normocephalic, moist mucous membranes, clear oropharnyx. Lungs: Clear to auscultation bilaterally. Heart: Regular rate and rhythm. No rubs, murmurs, or gallops. Abdomen: Soft, moderately tender with palpation, nondistended, normoactive bowel sounds. Musculoskeletal: No edema, cyanosis, or clubbing. Neuro: Alert, answering all questions appropriately. Cranial nerves grossly intact. Skin: No rashes or petechiae noted. Psych: Normal affect. Lymphatics: No cervical, calvicular, axillary or inguinal LAD.    PCN: Unknown  Codeine: Unknown    cyclobenzaprine 10 mg oral tablet: 1 tab(s) orally 3 times a day, Status: Active, Quantity: 90, Refills: 2   Tasigna 150 mg oral capsule: 2 cap(s) orally every 12 hours, Status: Active, Quantity: 120, Refills: 6   ranitidine 300 mg oral capsule: 1 cap(s) orally 2 times a day, Status: Active, Quantity: 0, Refills: None   Metoprolol Tartrate 25 mg oral tablet: 1 tab(s) orally 2 times a day, Status: Active, Quantity: 0, Refills: None   metformin 500 mg oral tablet: 1 tab(s) orally once a day, Status: Active, Quantity: 0, Refills: None   lovastatin 40 mg oral tablet: 1 tab(s) orally once a day, Status: Active, Quantity: 0, Refills: None   zolpidem 10 mg oral tablet: 1 tab(s) orally once a day (at bedtime), Status: Active, Quantity: 0, Refills: None   montelukast 10 mg oral tablet: 1 tab(s) orally once a day (in the evening), Status: Active, Quantity: 0,  Refills: None   Diovan HCT 160 mg-25 mg oral tablet: 1 tab(s) orally once a day, Status: Active, Quantity: 0, Refills: None  Laboratory Results: Hepatic:  18-Aug-14 15:58   Bilirubin, Total  1.1  Alkaline Phosphatase 117  SGPT (ALT) 29  SGOT (AST) 21  Total Protein, Serum 8.1  Albumin, Serum 3.8  Routine Chem:   18-Aug-14 15:58   Lipase  > 10000 (Result(s) reported on 25 Sep 2012 at 08:11PM.)  Glucose, Serum  248  BUN 16  Creatinine (comp) 0.81  Sodium, Serum  134  Potassium, Serum 3.5  Chloride, Serum 102  CO2, Serum 26  Calcium (Total), Serum 9.3  Osmolality (calc) 278  eGFR (African American) >60  eGFR (Non-African American) >60 (eGFR values <74m/min/1.73 m2 may be an indication of chronic kidney disease (CKD). Calculated eGFR is useful in patients with stable renal function. The eGFR calculation will not be reliable in acutely ill patients when serum creatinine is changing rapidly. It is not useful in  patients on dialysis. The eGFR calculation may not be applicable to patients at the low and high extremes of body sizes, pregnant women, and vegetarians.)  Anion Gap  6  Routine UA:  18-Aug-14 15:58   Color (UA) Yellow  Clarity (UA) Clear  Glucose (UA) >=500  Bilirubin (UA) Negative  Ketones (UA) Negative  Specific Gravity (UA) 1.020  Blood (UA) Negative  pH (UA) 5.0  Protein (UA) Negative  Nitrite (UA) Negative  Leukocyte Esterase (UA) Negative (Result(s) reported on 25 Sep 2012 at 04:26PM.)  RBC (UA) 2 /HPF  WBC (UA) 2 /HPF  Bacteria (UA) 2+  Epithelial Cells (UA) 3 /HPF  Mucous (UA) PRESENT (Result(s) reported on 25 Sep 2012 at 04:26PM.)  Routine Hem:  18-Aug-14 15:58   Hematocrit (CBC) 36.0  WBC (CBC)  11.6  RBC (CBC) 4.74  Hemoglobin (CBC) 12.0  Platelet Count (CBC) 186 (Result(s) reported on 25 Sep 2012 at 04:25PM.)  MCV  76  MCH  25.4  MCHC 33.4  RDW  15.8   Assessment and Plan: Impression:   CML on Tasigna, now with pancreatitis. Plan:   1.  CML: Patient's most recent PCR for the BCR-ABL mutation was negative.  She has been on 300 mg Tasigna twice a day for at least 4-5 years.  Although Tasigna reports and 8-17% chance of an elevated lipase, it is not list pancreatitis as a side effect.  Also, it would be unusual to develop this after being stable on the  medication for 4-5 years.  Despite this, agree with holding to Tasigna until her acute symptoms resolve.  We can rechallenge upon discharge to ensure her symptoms do not recur.  Patient expressed understanding and was in agreement with this plan. Pancreatitis: Unclear etiology, agree with current treatment. consult, will follow.  CC Referral:  cc: Dr. HGinette Pitman  Electronic Signatures: FDelight Hoh(MD)  (Signed 19-Aug-14 17:46)  Authored: HISTORY OF PRESENT ILLNESS, PFSH, ROS, NURSING NOTES, PE, ALLERGIES, HOME MEDICATIONS, LABS, ASSESSMENT AND PLAN, CC Referring Physician   Last Updated: 19-Aug-14 17:46 by FDelight Hoh(MD)

## 2014-05-31 NOTE — Consult Note (Signed)
Patient's pancreatitis is improving, possible discharge today.  Continue to hold Tasigna until patient is evaluated in the [facility] on October 17, 2012 at 3 PM.  At this point, will rechallenge with her current dose of Tasigna which is 300 mg twice per day. consult.  Call with questions.  Electronic Signatures: Delight Hoh (MD)  (Signed on 22-Aug-14 09:12)  Authored  Last Updated: 22-Aug-14 09:12 by Delight Hoh (MD)

## 2014-05-31 NOTE — H&P (Signed)
PATIENT NAME:  Cassidy Gates, Cassidy Gates MR#:  [id number] DATE OF BIRTH:  11-28-1965  DATE OF ADMISSION:  09/25/2012  PRIMARY CARE PHYSICIAN: Dr. Geralyn Corwin.   ONCOLOGIST: Dr. Delight Hoh.   REFERRING PHYSICIAN: Sabino Snipes, PA. She is covered by [employer].    CHIEF COMPLAINT: Abdominal pain and vomiting.   HISTORY OF PRESENT ILLNESS: Cassidy Gates is a 49 year old African American female with history of chronic myelogenous leukemia (CML), diabetes mellitus type 2, hypertension and cardiomyopathy. Presented to the Emergency Room with a 2-day history of severe epigastric pain. In fact, it is involving most of the central abdominal area and radiating to the back. The pain is described as sharp pain, then changed to dull, then goes back to sharp again. The severity may reach 9 on a scale of 10. Associated with at least 3 episodes of vomiting, a couple of them mixed with some blood. She has no fever. No diarrhea. No melena or hematochezia. Evaluation here at the Emergency Department revealed evidence of acute pancreatitis manifested by significantly elevated lipase reaching 10,000. Her ultrasound of the abdomen was unremarkable except for hepatomegaly. The patient is now in the process to be admitted under the care of the hospitalist for further evaluation and management.   REVIEW OF SYSTEMS:   CONSTITUTIONAL: Denies any fever. No chills. No fatigue.  EYES: No blurring of vision. No double vision.  ENT: No hearing impairment. No sore throat. No dysphagia.  CARDIOVASCULAR: No chest pain. No shortness of breath. No syncope.  RESPIRATORY: No cough. No hemoptysis. No chest pain. No shortness of breath.  GASTROINTESTINAL: Reports abdominal pain as described above, associated with nausea and a few episodes of vomiting. Some of them had some red blood with the emesis. No melena or hematochezia.  GENITOURINARY: No dysuria. No frequency of urination.  MUSCULOSKELETAL: No joint pain or swelling. No muscular  pain or swelling.  INTEGUMENTARY: No skin rash. No ulcers.  NEUROLOGY: No focal weakness. No seizure activity. No headache.  PSYCHIATRY: No anxiety. No depression.  ENDOCRINE: No polyuria or polydipsia. No heat or cold intolerance.   PAST MEDICAL HISTORY: CML, diabetes mellitus type 2, systemic hypertension, cardiomyopathy and congestive heart failure. I could not find a previous echo in the last few years. This may be done on an outpatient. There was one ordered in 2007, but I cannot retrieve the results. The patient also has history of hypercholesterolemia.   PAST SURGICAL HISTORY: Hysterectomy and removal of 1 ovary.   FAMILY HISTORY: Her mother died at the age of 75 after struggling with breast cancer. Her father has hypertension. Has a sister who has hypertension.   SOCIAL HABITS: Nonsmoker. No history of alcohol or drug abuse.   SOCIAL HISTORY: She is married, living with her husband, and she works as a Actuary for elderly people.   ADMISSION MEDICATIONS: Tasigna 150 mg 2 capsules twice a day, zolpidem or Ambien 10 mg at bedtime, ranitidine 300 mg twice a day, montelukast or Singulair 10 mg once a day, metoprolol tartrate 25 mg twice a day, metformin 500 mg once a day, lovastatin 40 mg a day, Diovan with hydrochlorothiazide 160/25 once a day, cyclobenzaprine or Flexeril 10 mg 3 times a day.   ALLERGIES: PENICILLIN AND CODEINE. Both of them cause skin rash.   PHYSICAL EXAMINATION:  VITAL SIGNS: Blood pressure 196/90, respiratory rate 20, pulse 72, temperature 98.7, oxygen saturation 99%.  GENERAL APPEARANCE: This is a young female lying in bed in no acute distress.  HEAD AND  NECK: No pallor. No icterus. No cyanosis. She has lost all of her hair, and she has baldness. Ear examination revealed normal hearing, no discharge, no lesions. Examination of the nose showed no bleeding, no discharge, no ulcers. Oropharyngeal examination revealed normal lips and tongue. No oral thrush, no ulcers. Eye  examination revealed normal eyelids and conjunctivae. Pupils about 4 to 5 mm, round, equal and reactive to light. Neck is supple. Trachea at midline. No thyromegaly. No cervical lymphadenopathy. No masses.  HEART: Normal S1, S2. No S3, S4. No murmur. No gallop. No carotid bruits.  RESPIRATORY: Normal breathing pattern without use of accessory muscles. No rales. No wheezing.  ABDOMEN: Soft, mild to moderate tenderness nonlocalized throughout the abdomen upon deep palpation, but there is no rebound and no rigidity. No masses. No hepatosplenomegaly. She is obese. Although the ultrasound shows hepatomegaly.  SKIN: No ulcers. No subcutaneous nodules.  MUSCULOSKELETAL: No joint swelling. No clubbing.  NEUROLOGIC: Cranial nerves II through XII were intact. No focal motor deficit.  PSYCHIATRIC: The patient is alert, oriented x3. Mood and affect were normal.   LABORATORY FINDINGS AND RADIOLOGIC DATA: Ultrasound of the abdomen showed prominent liver, measures 20 cm, consistent with enlarged liver. Negative Murphy sign. Gallbladder wall is 1.6 mm. No gallstones. Her serum glucose 248, BUN 16, creatinine 0.8, sodium 134, potassium 3.5. Lipase more than 10,000. Bilirubin 1.1, AST 21, ALT 29, alkaline phosphatase 117. CBC showed white count of 11,000, hemoglobin 12, hematocrit 36, platelet count 186. Urinalysis unremarkable except for more than 500 glucose.   ASSESSMENT:  1. Acute pancreatitis. Etiology is unclear.  2. Hematemesis. This could be secondary to Mallory-Weiss tear after the initial vomiting. However, differential diagnosis will include esophagitis, gastritis, peptic ulcer disease, etc.  3. Chronic myelogenous leukemia (CML).  4. Diabetes mellitus type 2, uncontrolled.  5. Systemic hypertension.  6. History of cardiomyopathy.  7. History of right pleural effusion in 2008. It was exudative pleural effusion and required chest tube placement.  8. Obesity.   PLAN: Will admit the patient to the medical  floor. Keep n.p.o. except for the medications. IV fluid with normal saline. Follow up on the lipase level. Pain control. Zofran p.r.n. for nausea and vomiting. IV Protonix 40 mg twice a day. GI consultation. I ordered CAT scan of the abdomen since the ultrasound was negative. Therefore, I will hold the metformin temporarily for 48 hours. Place on Accu-Chek and sliding scale. I will consult Dr. Grayland Ormond. Meanwhile, I will hold Tasigna since it can cause pancreatitis. Similarily will hold Lovastatin and Singulair as both carry small possibility of inducing pancreatitis. Resume the metoprolol and Diovan and monitor blood pressure to ensure adequate control. Check hemoglobin q.8 hours x3 to ensure stability. For deep vein thrombosis prophylaxis, anticoagulation is contraindicated since she has hematemesis. Therefore, I will use TED stockings and compression.   Time spent in evaluating this patient took more than 1 hour.    ____________________________ Clovis Pu. Lenore Manner, MD amd:gb D: 09/25/2012 22:56:24 ET T: 09/25/2012 23:12:56 ET JOB#: [id number]  cc: Clovis Pu. Lenore Manner, MD, <Dictator> Ellin Saba MD ELECTRONICALLY SIGNED 09/26/2012 6:39

## 2014-05-31 NOTE — Discharge Summary (Signed)
PATIENT NAME:  Cassidy Gates, Cassidy Gates MR#:  [id number] DATE OF BIRTH:  May 16, 1965  DATE OF ADMISSION:  09/25/2012 DATE OF DISCHARGE:  09/29/2012  DISCHARGE DIAGNOSIS:  1.  Acute pancreatitis.  2.  Gastritis and duodenitis.  3.  Type 2 diabetes.  4.  Hypertension.  5.  Chronic myeloid leukemia.  6.  History of cardiomyopathy.  7.  Obesity.  8.  Urinary tract infection.   CHIEF COMPLAINT: Abdominal pain, vomiting.   HISTORY OF PRESENT ILLNESS: Fredricka Kohrs is a 49 year old African American female with history of CML, type 2 diabetes and cardiomyopathy. Presented to ER with a 2 day history of severe upper abdominal pain. She described it as a sharp pain and subsequently changed to a dull ache.  Her serum lipase was noted to greater than 10,000 on admission and ultrasound was unremarkable other than hepatomegaly. CT scan showed evidence of pancreatitis, but no evidence of gallstones.   PAST MEDICAL HISTORY: Significant for CML,  type 2 diabetes, hypertension, cardiomyopathy, CHF.    PAST SURGICAL HISTORY: Significant for hysterectomy and unilateral ovariectomy.   PHYSICAL EXAMINATION: VITAL SIGNS: Blood pressure 196/90, respirations 20, pulse 72, temperature 98.7, O2 sat 99% on room air.  HEENT: NCAT. HEART: S1, S2.  LUNGS: Clear.  ABDOMEN: Soft. Evidence of tenderness in the epigastric area with mild guarding. No rebound.  EXTREMITIES: No edema.  NEUROLOGIC: Nonfocal.   LABORATORY AND DIAGNOSTICS: Ultrasound of the abdomen showed prominent liver. Gallbladder wall was 1.6 mm.  No evidence of gallstones.   Glucose 248, BUN 16, creatinine 0.8, sodium 134, potassium 3.5, lipase greater than 10,000, bilirubin 1.1, AST 21, ALT 29, alk phos 117. CBC showed WBC count 11,000, hemoglobin 12, hematocrit 36 and platelets 186.   HOSPITAL COURSE: The patient was admitted to [facility] and received intravenous morphine for pain control. She was also seen in consultation by Dr. Grayland Ormond and Dr. Allen Norris, and she also  underwent an EGD which showed evidence for gastritis and hiatal hernia. The duodenum appeared to be normal. A medium size hiatal hernia was present. The patient was also started on IV Protonix. Symptomatically she felt better and her serum lipase also trended down gradually to normal levels and it came down to 339 on day of discharge. The patient also was noted to have a urinary tract infection for which she was treated with Cipro. Cultures were still pending. A CT scan of the abdomen showed normal appendix, atelectasis of the lung bases. Liver was normal. Spleen was normal. Pancreatic edema cannot be excluded. Bladder was nondistended. The patient's diet was gradually advanced, and she was stable at time of discharge. I discussed the case also with Dr. Grayland Ormond who felt that Tasigna could be held until her lipase is rechecked as an outpatient. She was discharged in stable condition.   DISCHARGE MEDICATIONS: 1.  Zolpidem 10 mg once a day. 2.  Metformin 500 mg once a day. 3.  Valsartan 320 mg once a day. 4.  Ciprofloxacin 500 mg q. 12 for 5 days. 5.  Metoprolol 25 mg b.i.d.  6.  Percocet 5/325 mg 1 tablet p.o. b.i.d. p.r.n.  7.  Cyclobenzaprine 10 mg p.o. q. 8 p.r.n.  8.  Omeprazole 20 mg p.o. b.i.d.   DISCHARGE INSTRUCTIONS:  She was advised low carb diet, low residue diet and gradually advanced to ADA diet. Follow up with me, Dr. [name], in 1 to 2 weeks. Also follow up with Dr. Grayland Ormond in 1 to 2 weeks' time. The patient has been advised  to report to the ER if she has worsening abdominal pain at any time.    Total time spent in discharge of patient: 35 minutes ____________________________ [name], MD vh:sb D: 09/29/2012 13:06:37 ET T: 09/29/2012 13:37:42 ET JOB#: [id number]  cc: [name], MD, <Dictator> [name] MD ELECTRONICALLY SIGNED 10/12/2012 17:37

## 2014-05-31 NOTE — Consult Note (Signed)
Chief Complaint:  Subjective/Chief Complaint Denies abdomina pain, nausea or vomiting.  Tolerating regular diet well.  UTI on cipro.   VITAL SIGNS/ANCILLARY NOTES: **Vital Signs.:   22-Aug-14 11:54  Vital Signs Type Routine  Temperature Temperature (F) 98.2  Temperature Source oral  Pulse Pulse 110  Respirations Respirations 20  Systolic BP Systolic BP 785  Diastolic BP (mmHg) Diastolic BP (mmHg) 87  Mean BP 102  BP Source  if not from Vital Sign Device non-invasive  Pulse Ox % Pulse Ox % 92  Pulse Ox Activity Level  At rest  Oxygen Delivery Room Air/ 21 %   Brief Assessment:  GEN well developed, no acute distress, obese, A/Ox3. Husband at bedside.   Cardiac Regular   Respiratory normal resp effort   Gastrointestinal details normal Soft  Nontender  Nondistended  Bowel sounds normal  No rebound tenderness  No gaurding   EXTR positive edema, negative edema, trace PT bilat   Additional Physical Exam Skin: warm, dry   Lab Results: Hepatic:  22-Aug-14 06:55   Bilirubin, Total 0.9  Alkaline Phosphatase 115  SGPT (ALT) 22  SGOT (AST) 19  Total Protein, Serum 7.4  Albumin, Serum  2.6  Routine Chem:  22-Aug-14 06:55   Glucose, Serum  155  BUN 9  Creatinine (comp) 0.67  Sodium, Serum 136  Potassium, Serum 3.5  Chloride, Serum 105  CO2, Serum 21  Calcium (Total), Serum 9.1  Osmolality (calc) 274  eGFR (African American) >60  eGFR (Non-African American) >60 (eGFR values <79m/min/1.73 m2 may be an indication of chronic kidney disease (CKD). Calculated eGFR is useful in patients with stable renal function. The eGFR calculation will not be reliable in acutely ill patients when serum creatinine is changing rapidly. It is not useful in  patients on dialysis. The eGFR calculation may not be applicable to patients at the low and high extremes of body sizes, pregnant women, and vegetarians.)  Anion Gap 10  Lipase 339 (Result(s) reported on 29 Sep 2012 at 07:54AM.)   Routine UA:  22-Aug-14 08:50   Color (UA) Yellow  Clarity (UA) Hazy  Glucose (UA) Negative  Bilirubin (UA) Negative  Ketones (UA) 2+  Specific Gravity (UA) 1.016  Blood (UA) 3+  pH (UA) 5.0  Protein (UA) 100 mg/dL  Nitrite (UA) Negative  Leukocyte Esterase (UA) 2+ (Result(s) reported on 29 Sep 2012 at 09:11AM.)  RBC (UA) 114 /HPF  WBC (UA) 51 /HPF  Bacteria (UA) 3+  Epithelial Cells (UA) 1 /HPF  Mucous (UA) PRESENT (Result(s) reported on 29 Sep 2012 at 09:11AM.)  Routine Hem:  22-Aug-14 06:55   WBC (CBC) 10.4  RBC (CBC) 4.44  Hemoglobin (CBC)  11.4  Hematocrit (CBC)  34.0  Platelet Count (CBC) 199  MCV  77  MCH  25.6  MCHC 33.5  RDW  15.9  Neutrophil % 58.6  Lymphocyte % 21.8  Monocyte % 15.7  Eosinophil % 3.0  Basophil % 0.9  Neutrophil # 6.1  Lymphocyte # 2.3  Monocyte #  1.6  Eosinophil # 0.3  Basophil # 0.1 (Result(s) reported on 29 Sep 2012 at 07:58AM.)   Assessment/Plan:  Assessment/Plan:  Assessment Acute pancreatitis:  likely secondary to medications.  Resolved.  Tysigna on hold. UTI: on abx Gastritis   Plan 1) Complete cipro 2) PPI daily 3) Call if any further problems 4) FU OV w/ uKoreain 4 weeks   Electronic Signatures: JAndria Meuse(NP)  (Signed 22-Aug-14 13:33)  Authored: Chief Complaint, VITAL SIGNS/ANCILLARY NOTES,  Brief Assessment, Lab Results, Assessment/Plan   Last Updated: 22-Aug-14 13:33 by Andria Meuse (NP)

## 2014-08-11 ENCOUNTER — Other Ambulatory Visit
Admission: RE | Admit: 2014-08-11 | Discharge: 2014-08-11 | Disposition: A | Discharge status: Home / Self Care | Payer: Medicare Other | Source: Ambulatory Visit | Attending: Family Medicine | Admitting: Family Medicine

## 2014-08-11 DIAGNOSIS — R609 Edema, unspecified: Secondary | ICD-10-CM | POA: Insufficient documentation

## 2014-08-11 LAB — BRAIN NATRIURETIC PEPTIDE: B Natriuretic Peptide: 597 pg/mL — ABNORMAL HIGH (ref 0.0–100.0)

## 2014-08-20 ENCOUNTER — Inpatient Hospital Stay
Admission: EM | Admit: 2014-08-20 | Discharge: 2014-08-23 | DRG: 291 | Disposition: A | Discharge status: Home / Self Care | Payer: Medicare Other | Attending: Internal Medicine | Admitting: Internal Medicine

## 2014-08-20 ENCOUNTER — Emergency Department: Payer: Medicare Other

## 2014-08-20 ENCOUNTER — Encounter: Payer: Self-pay | Admitting: Emergency Medicine

## 2014-08-20 DIAGNOSIS — I10 Essential (primary) hypertension: Secondary | ICD-10-CM

## 2014-08-20 DIAGNOSIS — E119 Type 2 diabetes mellitus without complications: Secondary | ICD-10-CM | POA: Diagnosis present

## 2014-08-20 DIAGNOSIS — J9601 Acute respiratory failure with hypoxia: Secondary | ICD-10-CM | POA: Diagnosis present

## 2014-08-20 DIAGNOSIS — Z79899 Other long term (current) drug therapy: Secondary | ICD-10-CM

## 2014-08-20 DIAGNOSIS — I509 Heart failure, unspecified: Secondary | ICD-10-CM

## 2014-08-20 DIAGNOSIS — I5033 Acute on chronic diastolic (congestive) heart failure: Secondary | ICD-10-CM | POA: Diagnosis not present

## 2014-08-20 DIAGNOSIS — Z803 Family history of malignant neoplasm of breast: Secondary | ICD-10-CM

## 2014-08-20 DIAGNOSIS — N183 Chronic kidney disease, stage 3 (moderate): Secondary | ICD-10-CM | POA: Diagnosis present

## 2014-08-20 DIAGNOSIS — Z883 Allergy status to other anti-infective agents status: Secondary | ICD-10-CM

## 2014-08-20 DIAGNOSIS — Z6841 Body Mass Index (BMI) 40.0 and over, adult: Secondary | ICD-10-CM

## 2014-08-20 DIAGNOSIS — N179 Acute kidney failure, unspecified: Secondary | ICD-10-CM

## 2014-08-20 DIAGNOSIS — I129 Hypertensive chronic kidney disease with stage 1 through stage 4 chronic kidney disease, or unspecified chronic kidney disease: Secondary | ICD-10-CM | POA: Diagnosis present

## 2014-08-20 DIAGNOSIS — I5031 Acute diastolic (congestive) heart failure: Secondary | ICD-10-CM

## 2014-08-20 DIAGNOSIS — G4733 Obstructive sleep apnea (adult) (pediatric): Secondary | ICD-10-CM

## 2014-08-20 DIAGNOSIS — Z880 Allergy status to penicillin: Secondary | ICD-10-CM

## 2014-08-20 DIAGNOSIS — C9210 Chronic myeloid leukemia, BCR/ABL-positive, not having achieved remission: Secondary | ICD-10-CM

## 2014-08-20 DIAGNOSIS — E1159 Type 2 diabetes mellitus with other circulatory complications: Secondary | ICD-10-CM

## 2014-08-20 DIAGNOSIS — R0602 Shortness of breath: Secondary | ICD-10-CM

## 2014-08-20 HISTORY — DX: Obstructive sleep apnea (adult) (pediatric): G47.33

## 2014-08-20 HISTORY — DX: Chronic diastolic (congestive) heart failure: I50.32

## 2014-08-20 HISTORY — DX: Chronic myeloid leukemia, BCR/ABL-positive, not having achieved remission: C92.10

## 2014-08-20 HISTORY — DX: Essential (primary) hypertension: I10

## 2014-08-20 HISTORY — DX: Obesity, unspecified: E66.9

## 2014-08-20 LAB — COMPREHENSIVE METABOLIC PANEL
ALK PHOS: 72 U/L (ref 38–126)
ALT: 27 U/L (ref 14–54)
AST: 19 U/L (ref 15–41)
Albumin: 3.7 g/dL (ref 3.5–5.0)
Anion gap: 14 (ref 5–15)
BUN: 17 mg/dL (ref 6–20)
CALCIUM: 9 mg/dL (ref 8.9–10.3)
CO2: 20 mmol/L — AB (ref 22–32)
Chloride: 103 mmol/L (ref 101–111)
Creatinine, Ser: 1.24 mg/dL — ABNORMAL HIGH (ref 0.44–1.00)
GFR calc Af Amer: 59 mL/min — ABNORMAL LOW (ref >60)
GFR calc non Af Amer: 51 mL/min — ABNORMAL LOW (ref >60)
GLUCOSE: 118 mg/dL — AB (ref 65–99)
Potassium: 3.7 mmol/L (ref 3.5–5.1)
Sodium: 137 mmol/L (ref 135–145)
TOTAL PROTEIN: 7 g/dL (ref 6.5–8.1)
Total Bilirubin: 0.8 mg/dL (ref 0.3–1.2)

## 2014-08-20 LAB — CBC
HCT: 36.9 % (ref 35.0–47.0)
Hemoglobin: 11.9 g/dL — ABNORMAL LOW (ref 12.0–16.0)
MCH: 26.3 pg (ref 26.0–34.0)
MCHC: 32.2 g/dL (ref 32.0–36.0)
MCV: 81.5 fL (ref 80.0–100.0)
PLATELETS: 187 10*3/uL (ref 150–440)
RBC: 4.53 MIL/uL (ref 3.80–5.20)
RDW: 15.1 % — ABNORMAL HIGH (ref 11.5–14.5)
WBC: 7.6 10*3/uL (ref 3.6–11.0)

## 2014-08-20 LAB — BRAIN NATRIURETIC PEPTIDE: B Natriuretic Peptide: 1811 pg/mL — ABNORMAL HIGH (ref 0.0–100.0)

## 2014-08-20 LAB — TROPONIN I: Troponin I: <0.03 ng/mL (ref <0.031)

## 2014-08-20 MED ORDER — FUROSEMIDE 10 MG/ML IJ SOLN
60.0000 mg | Freq: Once | INTRAMUSCULAR | Status: AC
  Administered 2014-08-21: 60 mg via INTRAVENOUS
  Filled 2014-08-20: qty 8

## 2014-08-20 NOTE — ED Notes (Signed)
Charge RN aware of need for bed. 

## 2014-08-20 NOTE — ED Notes (Signed)
o2 saturation 92% on ra post ambulation

## 2014-08-20 NOTE — ED Notes (Signed)
Pt presents to ER alert and in NAD. 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 Pt states SOB with ambulation. Resp even and unlabored. Pt is pale.

## 2014-08-20 NOTE — ED Provider Notes (Signed)
[facility] Emergency Department Provider Note  Time seen: 11:12 PM  I have reviewed the triage vital signs and the nursing notes.   HISTORY  Chief Complaint Shortness of Breath    HPI Cassidy Gates is a 49 y.o. female with a past medical history of CML, hypertension, diabetes, CHF who presents the emergency department with shortness of breath. According to the patient for the past 2-3 months she has been increasingly short of breath, has also gained approximately 40 pounds in that duration. Patient states her shortness of breath has continued to worsen especially over the last 2-3 weeks. Her Lasix was increased by her doctor from 40 mg 3 times a day to 60 mg 3 times a day. When that failed and she continued to gain fluid they've placed her on torsemide 20 mg daily on 08/16/14. Patient states her shortness of breath has continued to worsen, now she has no longer able to ambulate more than a few feet without having to rest due to shortness of breath. Denies any chest pain. Patient states continues to accumulate fluid in her legs and abdomen. Given her worsening shortness of breath she came to the emergency department for evaluation. As her dyspnea as moderate at rest, severe with exertion. .    Past Medical History  Diagnosis Date  . Leukemia   . Hypertension   . Diabetes mellitus without complication   . CHF (congestive heart failure)     There are no active problems to display for this patient.   Past Surgical History  Procedure Laterality Date  . Partial hysterectomy      Current Outpatient Rx  Name  Route  Sig  Dispense  Refill  . diphenhydrAMINE (SOMINEX) 25 MG tablet   Oral   Take 25 mg by mouth at bedtime as needed for sleep.         Marland Kitchen docusate sodium (COLACE) 100 MG capsule   Oral   Take 100 mg by mouth daily as needed for mild constipation.         . ferrous sulfate 325 (65 FE) MG tablet   Oral   Take 325 mg by mouth daily with  breakfast.         . hydrALAZINE (APRESOLINE) 50 MG tablet   Oral   Take 50 mg by mouth 3 (three) times daily.         Marland Kitchen levocetirizine (XYZAL) 5 MG tablet   Oral   Take 5 mg by mouth every evening.         . lovastatin (MEVACOR) 40 MG tablet   Oral   Take 40 mg by mouth at bedtime.         . metFORMIN (GLUCOPHAGE) 500 MG tablet   Oral   Take by mouth 2 (two) times daily with a meal.         . metoprolol (LOPRESSOR) 100 MG tablet   Oral   Take 200 mg by mouth 1 day or 1 dose.         . montelukast (SINGULAIR) 10 MG tablet   Oral   Take 10 mg by mouth at bedtime.         . ranitidine (ZANTAC) 150 MG tablet   Oral   Take 150 mg by mouth 2 (two) times daily.         Marland Kitchen senna (SENOKOT) 8.6 MG TABS tablet   Oral   Take 1 tablet by mouth daily as needed for mild constipation.         Marland Kitchen  torsemide (DEMADEX) 20 MG tablet   Oral   Take 20 mg by mouth daily.         . valsartan (DIOVAN) 320 MG tablet   Oral   Take 320 mg by mouth daily.         . vitamin C (ASCORBIC ACID) 500 MG tablet   Oral   Take 1,000 mg by mouth daily.         Marland Kitchen zolpidem (AMBIEN) 10 MG tablet   Oral   Take 10 mg by mouth at bedtime as needed for sleep.         . bosutinib (BOSULIF) 500 MG tablet   Oral   Take 500 mg by mouth daily with breakfast. Take with food.           Allergies Codeine and Penicillins  History reviewed. No pertinent family history.  Social History History  Substance Use Topics  . Smoking status: Never Smoker   . Smokeless tobacco: Not on file  . Alcohol Use: No    Review of Systems Constitutional: Negative for fever. Cardiovascular: Negative for chest pain. Respiratory: Positive for shortness breath. Gastrointestinal: Negative for abdominal pain, vomiting and diarrhea. Musculoskeletal: Negative for back pain. Neurological: Negative for headache 10-point ROS otherwise  negative.  ____________________________________________   PHYSICAL EXAM:  VITAL SIGNS: ED Triage Vitals  Enc Vitals Group     BP 08/20/14 2029 160/93 mmHg     Pulse Rate 08/20/14 2029 83     Resp 08/20/14 2029 24     Temp 08/20/14 2029 98.1 F (36.7 C)     Temp Source 08/20/14 2029 Oral     SpO2 08/20/14 2029 93 %     Weight 08/20/14 2029 286 lb (129.729 kg)     Height 08/20/14 2029 5\' 5"  (1.651 m)     Head Cir --      Peak Flow --      Pain Score 08/20/14 2030 7     Pain Loc --      Pain Edu? --      Excl. in [place]? --     Constitutional: Alert and oriented. Well appearing and in no distress. ENT   Mouth/Throat: Mucous membranes are moist. Cardiovascular: Normal rate, regular rhythm. No murmur Respiratory: Normal respiratory effort without tachypnea nor retractions. Appears to have mild rales in bilateral lower lung fields. Gastrointestinal: Soft and nontender. No distention.  Musculoskeletal: Moderate lower extremity tenderness bilaterally, 2-3+ bilateral lower extremity edema. Appears equal bilaterally. Neurologic:  Normal speech and language. No gross focal neurologic deficits  Skin:  Skin is warm, dry and intact.  Psychiatric: Mood and affect are normal. Speech and behavior are normal.   ____________________________________________    EKG  EKG reviewed and interpreted by myself shows normal sinus rhythm at 83 bpm, narrow QRS, normal axis, normal intervals, nonspecific but no concerning ST changes noted. ____________________________________________    RADIOLOGY  Chest x-ray shows cardiomegaly with vascular congestion. Also small right pleural effusion.  ____________________________________________    INITIAL IMPRESSION / ASSESSMENT AND PLAN / ED COURSE  Pertinent labs & imaging results that were available during my care of the patient were reviewed by me and considered in my medical decision making (see chart for details).  Patient with worsening  shortness of breath despite home medication changes. Currently satting 90-92% on room air. Labs are largely within normal limits besides elevated BNP. We will ambulate the patient to evaluate oxygen saturation, and determine if the patient needs admission for  IV diuresis.  Patient continues to have a borderline oxygen saturation 88-89 percent in bed, minimal drop with ambulation, the patient felt extremely short of breath with ambulation. Given her failure of outpatient treatment we will admit the patient for IV diuresis.  ____________________________________________   FINAL CLINICAL IMPRESSION(S) / ED DIAGNOSES  Dyspnea Congestive heart failure   Harvest Dark, MD 08/20/14 2341

## 2014-08-21 ENCOUNTER — Inpatient Hospital Stay (HOSPITAL_COMMUNITY)
Admit: 2014-08-21 | Discharge: 2014-08-21 | Disposition: A | Discharge status: Home / Self Care | Payer: Medicare Other | Attending: Internal Medicine | Admitting: Internal Medicine

## 2014-08-21 ENCOUNTER — Inpatient Hospital Stay: Payer: Medicare Other

## 2014-08-21 ENCOUNTER — Encounter: Payer: Self-pay | Admitting: Internal Medicine

## 2014-08-21 DIAGNOSIS — I10 Essential (primary) hypertension: Secondary | ICD-10-CM

## 2014-08-21 DIAGNOSIS — Z90710 Acquired absence of both cervix and uterus: Secondary | ICD-10-CM

## 2014-08-21 DIAGNOSIS — C9290 Myeloid leukemia, unspecified, not having achieved remission: Secondary | ICD-10-CM | POA: Diagnosis not present

## 2014-08-21 DIAGNOSIS — I5033 Acute on chronic diastolic (congestive) heart failure: Secondary | ICD-10-CM | POA: Insufficient documentation

## 2014-08-21 DIAGNOSIS — E119 Type 2 diabetes mellitus without complications: Secondary | ICD-10-CM | POA: Diagnosis present

## 2014-08-21 DIAGNOSIS — Z79899 Other long term (current) drug therapy: Secondary | ICD-10-CM | POA: Diagnosis not present

## 2014-08-21 DIAGNOSIS — C9210 Chronic myeloid leukemia, BCR/ABL-positive, not having achieved remission: Secondary | ICD-10-CM

## 2014-08-21 DIAGNOSIS — Z794 Long term (current) use of insulin: Secondary | ICD-10-CM

## 2014-08-21 DIAGNOSIS — G4733 Obstructive sleep apnea (adult) (pediatric): Secondary | ICD-10-CM | POA: Diagnosis present

## 2014-08-21 DIAGNOSIS — E669 Obesity, unspecified: Secondary | ICD-10-CM

## 2014-08-21 DIAGNOSIS — Z7982 Long term (current) use of aspirin: Secondary | ICD-10-CM

## 2014-08-21 DIAGNOSIS — G4730 Sleep apnea, unspecified: Secondary | ICD-10-CM

## 2014-08-21 DIAGNOSIS — E1159 Type 2 diabetes mellitus with other circulatory complications: Secondary | ICD-10-CM | POA: Diagnosis not present

## 2014-08-21 DIAGNOSIS — Z803 Family history of malignant neoplasm of breast: Secondary | ICD-10-CM

## 2014-08-21 DIAGNOSIS — Z883 Allergy status to other anti-infective agents status: Secondary | ICD-10-CM | POA: Diagnosis not present

## 2014-08-21 DIAGNOSIS — N179 Acute kidney failure, unspecified: Secondary | ICD-10-CM | POA: Diagnosis present

## 2014-08-21 DIAGNOSIS — Z6841 Body Mass Index (BMI) 40.0 and over, adult: Secondary | ICD-10-CM | POA: Diagnosis not present

## 2014-08-21 DIAGNOSIS — I5031 Acute diastolic (congestive) heart failure: Secondary | ICD-10-CM | POA: Diagnosis not present

## 2014-08-21 DIAGNOSIS — R0602 Shortness of breath: Secondary | ICD-10-CM | POA: Diagnosis not present

## 2014-08-21 DIAGNOSIS — I509 Heart failure, unspecified: Secondary | ICD-10-CM | POA: Diagnosis present

## 2014-08-21 DIAGNOSIS — R609 Edema, unspecified: Secondary | ICD-10-CM

## 2014-08-21 DIAGNOSIS — Z880 Allergy status to penicillin: Secondary | ICD-10-CM | POA: Diagnosis not present

## 2014-08-21 DIAGNOSIS — I129 Hypertensive chronic kidney disease with stage 1 through stage 4 chronic kidney disease, or unspecified chronic kidney disease: Secondary | ICD-10-CM | POA: Diagnosis present

## 2014-08-21 DIAGNOSIS — N183 Chronic kidney disease, stage 3 (moderate): Secondary | ICD-10-CM | POA: Diagnosis present

## 2014-08-21 DIAGNOSIS — J9601 Acute respiratory failure with hypoxia: Secondary | ICD-10-CM | POA: Diagnosis present

## 2014-08-21 LAB — GLUCOSE, CAPILLARY
GLUCOSE-CAPILLARY: 130 mg/dL — AB (ref 65–99)
Glucose-Capillary: 132 mg/dL — ABNORMAL HIGH (ref 65–99)
Glucose-Capillary: 122 mg/dL — ABNORMAL HIGH (ref 65–99)
Glucose-Capillary: 106 mg/dL — ABNORMAL HIGH (ref 65–99)

## 2014-08-21 LAB — CBC
HEMATOCRIT: 38.7 % (ref 35.0–47.0)
HEMOGLOBIN: 12.3 g/dL (ref 12.0–16.0)
MCH: 26 pg (ref 26.0–34.0)
MCHC: 31.8 g/dL — ABNORMAL LOW (ref 32.0–36.0)
MCV: 81.6 fL (ref 80.0–100.0)
PLATELETS: 198 10*3/uL (ref 150–440)
RBC: 4.74 MIL/uL (ref 3.80–5.20)
RDW: 15.3 % — AB (ref 11.5–14.5)
WBC: 7.2 10*3/uL (ref 3.6–11.0)

## 2014-08-21 LAB — BASIC METABOLIC PANEL
ANION GAP: 8 (ref 5–15)
BUN: 18 mg/dL (ref 6–20)
CO2: 25 mmol/L (ref 22–32)
Calcium: 9.2 mg/dL (ref 8.9–10.3)
Chloride: 108 mmol/L (ref 101–111)
Creatinine, Ser: 1.25 mg/dL — ABNORMAL HIGH (ref 0.44–1.00)
GFR calc Af Amer: 58 mL/min — ABNORMAL LOW (ref >60)
GFR, EST NON AFRICAN AMERICAN: 50 mL/min — AB (ref >60)
Glucose, Bld: 96 mg/dL (ref 65–99)
POTASSIUM: 3.5 mmol/L (ref 3.5–5.1)
Sodium: 141 mmol/L (ref 135–145)

## 2014-08-21 LAB — TROPONIN I
Troponin I: <0.03 ng/mL (ref <0.031)
Troponin I: <0.03 ng/mL (ref <0.031)
Troponin I: <0.03 ng/mL (ref <0.031)

## 2014-08-21 MED ORDER — FERROUS SULFATE 325 (65 FE) MG PO TABS
325.0000 mg | ORAL_TABLET | Freq: Every day | ORAL | Status: DC
  Administered 2014-08-21 – 2014-08-23 (×3): 325 mg via ORAL
  Filled 2014-08-21 (×3): qty 1

## 2014-08-21 MED ORDER — HYDROCHLOROTHIAZIDE 25 MG PO TABS
25.0000 mg | ORAL_TABLET | Freq: Every day | ORAL | Status: DC
  Administered 2014-08-21 – 2014-08-22 (×2): 25 mg via ORAL
  Filled 2014-08-21 (×2): qty 1

## 2014-08-21 MED ORDER — TECHNETIUM TO 99M ALBUMIN AGGREGATED
4.3100 | Freq: Once | INTRAVENOUS | Status: AC | PRN
  Administered 2014-08-21: 4.31 via INTRAVENOUS

## 2014-08-21 MED ORDER — INSULIN ASPART 100 UNIT/ML ~~LOC~~ SOLN
0.0000 [IU] | Freq: Three times a day (TID) | SUBCUTANEOUS | Status: DC
  Administered 2014-08-21 (×2): 1 [IU] via SUBCUTANEOUS
  Administered 2014-08-22 (×2): 2 [IU] via SUBCUTANEOUS
  Filled 2014-08-21: qty 2
  Filled 2014-08-21: qty 1
  Filled 2014-08-21: qty 2
  Filled 2014-08-21: qty 1

## 2014-08-21 MED ORDER — DIPHENHYDRAMINE HCL (SLEEP) 25 MG PO TABS
25.0000 mg | ORAL_TABLET | Freq: Every evening | ORAL | Status: DC | PRN
  Administered 2014-08-22: 25 mg via ORAL
  Filled 2014-08-21 (×2): qty 1

## 2014-08-21 MED ORDER — POTASSIUM CHLORIDE CRYS ER 20 MEQ PO TBCR
40.0000 meq | EXTENDED_RELEASE_TABLET | Freq: Once | ORAL | Status: AC
  Administered 2014-08-21: 40 meq via ORAL
  Filled 2014-08-21: qty 2

## 2014-08-21 MED ORDER — HYDRALAZINE HCL 50 MG PO TABS
50.0000 mg | ORAL_TABLET | Freq: Three times a day (TID) | ORAL | Status: DC
  Administered 2014-08-21 – 2014-08-22 (×4): 50 mg via ORAL
  Filled 2014-08-21 (×4): qty 1

## 2014-08-21 MED ORDER — ASPIRIN EC 81 MG PO TBEC
81.0000 mg | DELAYED_RELEASE_TABLET | Freq: Every day | ORAL | Status: DC
  Administered 2014-08-21 – 2014-08-23 (×3): 81 mg via ORAL
  Filled 2014-08-21 (×3): qty 1

## 2014-08-21 MED ORDER — POTASSIUM CHLORIDE CRYS ER 20 MEQ PO TBCR
20.0000 meq | EXTENDED_RELEASE_TABLET | Freq: Two times a day (BID) | ORAL | Status: DC
  Administered 2014-08-21 – 2014-08-23 (×4): 20 meq via ORAL
  Filled 2014-08-21 (×4): qty 1

## 2014-08-21 MED ORDER — METOPROLOL TARTRATE 100 MG PO TABS
200.0000 mg | ORAL_TABLET | ORAL | Status: DC
  Administered 2014-08-22 – 2014-08-23 (×2): 200 mg via ORAL
  Filled 2014-08-21 (×2): qty 2

## 2014-08-21 MED ORDER — ZOLPIDEM TARTRATE 5 MG PO TABS: 10.0000 mg | ORAL_TABLET | Freq: Every evening | ORAL | Status: DC | PRN

## 2014-08-21 MED ORDER — SODIUM CHLORIDE 0.9 % IJ SOLN
3.0000 mL | Freq: Two times a day (BID) | INTRAMUSCULAR | Status: DC
  Administered 2014-08-21 – 2014-08-23 (×5): 3 mL via INTRAVENOUS

## 2014-08-21 MED ORDER — ONDANSETRON HCL 4 MG/2ML IJ SOLN
4.0000 mg | Freq: Four times a day (QID) | INTRAMUSCULAR | Status: DC | PRN
Start: 2014-08-21 — End: 2014-08-23

## 2014-08-21 MED ORDER — ZOLPIDEM TARTRATE 5 MG PO TABS
5.0000 mg | ORAL_TABLET | Freq: Every evening | ORAL | Status: DC | PRN
  Administered 2014-08-21 – 2014-08-22 (×2): 5 mg via ORAL
  Filled 2014-08-21 (×2): qty 1

## 2014-08-21 MED ORDER — PRAVASTATIN SODIUM 20 MG PO TABS
40.0000 mg | ORAL_TABLET | Freq: Every day | ORAL | Status: DC
  Administered 2014-08-21 – 2014-08-22 (×2): 40 mg via ORAL
  Filled 2014-08-21 (×2): qty 2

## 2014-08-21 MED ORDER — FUROSEMIDE 10 MG/ML IJ SOLN: 60.0000 mg | Freq: Two times a day (BID) | INTRAMUSCULAR | Status: DC

## 2014-08-21 MED ORDER — METOPROLOL TARTRATE 100 MG PO TABS: 200.0000 mg | ORAL_TABLET | ORAL | Status: DC

## 2014-08-21 MED ORDER — SENNA 8.6 MG PO TABS
1.0000 | ORAL_TABLET | Freq: Every day | ORAL | Status: DC | PRN
  Administered 2014-08-22: 8.6 mg via ORAL
  Filled 2014-08-21: qty 1

## 2014-08-21 MED ORDER — HEPARIN SODIUM (PORCINE) 5000 UNIT/ML IJ SOLN
5000.0000 [IU] | Freq: Three times a day (TID) | INTRAMUSCULAR | Status: DC
  Administered 2014-08-21 – 2014-08-23 (×7): 5000 [IU] via SUBCUTANEOUS
  Filled 2014-08-21 (×7): qty 1

## 2014-08-21 MED ORDER — IRBESARTAN 150 MG PO TABS
300.0000 mg | ORAL_TABLET | Freq: Every day | ORAL | Status: DC
  Administered 2014-08-21 – 2014-08-23 (×3): 300 mg via ORAL
  Filled 2014-08-21 (×3): qty 2

## 2014-08-21 MED ORDER — LEVOCETIRIZINE DIHYDROCHLORIDE 5 MG PO TABS
5.0000 mg | ORAL_TABLET | Freq: Every evening | ORAL | Status: DC
  Filled 2014-08-21: qty 1

## 2014-08-21 MED ORDER — CETIRIZINE HCL 10 MG PO TABS
5.0000 mg | ORAL_TABLET | Freq: Every evening | ORAL | Status: DC
  Administered 2014-08-21 – 2014-08-22 (×2): 5 mg via ORAL
  Filled 2014-08-21 (×2): qty 1

## 2014-08-21 MED ORDER — ONDANSETRON HCL 4 MG PO TABS: 4.0000 mg | ORAL_TABLET | Freq: Four times a day (QID) | ORAL | Status: DC | PRN

## 2014-08-21 MED ORDER — FAMOTIDINE 20 MG PO TABS
20.0000 mg | ORAL_TABLET | Freq: Every day | ORAL | Status: DC
  Administered 2014-08-21 – 2014-08-23 (×3): 20 mg via ORAL
  Filled 2014-08-21 (×3): qty 1

## 2014-08-21 MED ORDER — TECHNETIUM TC 99M DIETHYLENETRIAME-PENTAACETIC ACID: 42.7800 | Freq: Once | INTRAVENOUS | Status: AC | PRN

## 2014-08-21 MED ORDER — DOCUSATE SODIUM 100 MG PO CAPS
100.0000 mg | ORAL_CAPSULE | Freq: Every day | ORAL | Status: DC | PRN
  Administered 2014-08-22: 100 mg via ORAL
  Filled 2014-08-21: qty 1

## 2014-08-21 MED ORDER — VITAMIN C 500 MG PO TABS
1000.0000 mg | ORAL_TABLET | Freq: Every day | ORAL | Status: DC
  Administered 2014-08-21 – 2014-08-23 (×3): 1000 mg via ORAL
  Filled 2014-08-21 (×3): qty 2

## 2014-08-21 MED ORDER — FUROSEMIDE 10 MG/ML IJ SOLN: 60.0000 mg | Freq: Two times a day (BID) | INTRAMUSCULAR | Status: AC

## 2014-08-21 MED ORDER — MONTELUKAST SODIUM 10 MG PO TABS
10.0000 mg | ORAL_TABLET | Freq: Every day | ORAL | Status: DC
  Administered 2014-08-21 – 2014-08-22 (×2): 10 mg via ORAL
  Filled 2014-08-21 (×2): qty 1

## 2014-08-21 NOTE — Consult Note (Signed)
[place]  [phone] [phone]  ID: Cassidy Gates OB: 1965-05-09  MR#: [id number]  XFG#:[id number]  No care team member to display  CHIEF COMPLAINT:  Chief Complaint  Patient presents with  . Shortness of Breath    Pt presents to ER alert and in NAD. 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 Pt states SOB with ambulation. Resp even and unlabored. Pt is pale.     INTERVAL HISTORY: Patient is a 49 year old female who was recently admitted to the hospital with increasing shortness of breath and dyspnea on exertion. She also has increased fluid retention and peripheral edema. She currently feels improved and back to her baseline. She has been on Bosulif for nearly 2 years and had been tolerating it well. She has no neurologic complaints. She denies any recent fevers. She denies any nausea, vomiting, consultation, or diarrhea. Patient otherwise feels well and offers no further specific complaints.  REVIEW OF SYSTEMS:   Review of Systems  Constitutional: Negative for fever.  Respiratory: Positive for shortness of breath.   Cardiovascular: Positive for leg swelling.  Gastrointestinal: Negative.     As per HPI. Otherwise, a complete review of systems is negatve.  PAST MEDICAL HISTORY: Past Medical History  Diagnosis Date  . CML (chronic myelocytic leukemia)   . Hypertension   . Diabetes mellitus without complication   . Chronic diastolic CHF (congestive heart failure)     a. echo: 06/11/2014: EF >55%, mild MR, AI, GR1DD  . OSA (obstructive sleep apnea)     a. on CPAP  . Obesity     PAST SURGICAL HISTORY: Past Surgical History  Procedure Laterality Date  . Partial hysterectomy    . Abdominal hysterectomy      FAMILY HISTORY Family History  Problem Relation Age of Onset  . Breast cancer Mother   . Heart disease Father   . Hypertension Father        ADVANCED DIRECTIVES:    HEALTH MAINTENANCE: History  Substance  Use Topics  . Smoking status: Never Smoker   . Smokeless tobacco: Not on file  . Alcohol Use: No     Colonoscopy:  PAP:  Bone density:  Lipid panel:  Allergies  Allergen Reactions  . Codeine Hives  . Penicillins Hives    Current Facility-Administered Medications  Medication Dose Route Frequency Provider Last Rate Last Dose  . aspirin EC tablet 81 mg  81 mg Oral Daily Juluis Mire, MD   81 mg at 08/21/14 0909  . cetirizine (ZYRTEC) tablet 5 mg  5 mg Oral QPM Lance Coon, MD      . diphenhydrAMINE [facility]) tablet 25 mg  25 mg Oral QHS PRN Juluis Mire, MD      . docusate sodium (COLACE) capsule 100 mg  100 mg Oral Daily PRN Juluis Mire, MD      . famotidine (PEPCID) tablet 20 mg  20 mg Oral Daily Juluis Mire, MD   20 mg at 08/21/14 0909  . ferrous sulfate tablet 325 mg  325 mg Oral Q breakfast Juluis Mire, MD   325 mg at 08/21/14 0925  . furosemide (LASIX) injection 60 mg  60 mg Intravenous Q12H Juluis Mire, MD   60 mg at 08/21/14 0600  . heparin injection 5,000 Units  5,000 Units Subcutaneous 3 times per day Juluis Mire, MD   5,000 Units at 08/21/14 0626  . hydrALAZINE (APRESOLINE) tablet 50 mg  50 mg Oral TID Juluis Mire, MD   50 mg at 08/21/14 0909  . hydrochlorothiazide (HYDRODIURIL) tablet 25 mg  25 mg Oral Daily Tama High III, MD   25 mg at 08/21/14 0909  . insulin aspart (novoLOG) injection 0-9 Units  0-9 Units Subcutaneous TID WC Juluis Mire, MD   1 Units at 08/21/14 1135  . irbesartan (AVAPRO) tablet 300 mg  300 mg Oral Daily Juluis Mire, MD   300 mg at 08/21/14 0909  . [START ON 08/22/2014] metoprolol (LOPRESSOR) tablet 200 mg  200 mg Oral 1 day or 1 dose Juluis Mire, MD      . montelukast (SINGULAIR) tablet 10 mg  10 mg Oral QHS Juluis Mire, MD      . ondansetron [facility]) tablet 4 mg  4 mg Oral Q6H PRN Juluis Mire, MD       Or  . ondansetron [facility]) injection 4 mg  4 mg Intravenous Q6H PRN Juluis Mire, MD      . potassium chloride SA (K-DUR,KLOR-CON) CR tablet 20 mEq  20 mEq Oral BID Ryan M Dunn, PA-C      . pravastatin (PRAVACHOL) tablet 40 mg  40 mg Oral q1800 Juluis Mire, MD      . senna [facility]) tablet 8.6 mg  1 tablet Oral Daily PRN Juluis Mire, MD      . sodium chloride 0.9 % injection 3 mL  3 mL Intravenous Q12H Juluis Mire, MD   3 mL at 08/21/14 0913  . vitamin C (ASCORBIC ACID) tablet 1,000 mg  1,000 mg Oral Daily Juluis Mire, MD   1,000 mg at 08/21/14 0909  . zolpidem (AMBIEN) tablet 5 mg  5 mg Oral QHS PRN Juluis Mire, MD        OBJECTIVE: Filed Vitals:   08/21/14 1100  BP: 136/69  Pulse: 79  Temp: 98 F (36.7 C)  Resp: 18     Body mass index is 45.46 kg/(m^2).    ECOG FS:0 - Asymptomatic  General: Well-developed, well-nourished, no acute distress. Eyes: Pink conjunctiva, anicteric sclera. HEENT: Normocephalic, moist mucous membranes, clear oropharnyx. Lungs: Clear to auscultation bilaterally. Heart: Regular rate and rhythm. No rubs, murmurs, or gallops. Abdomen: Soft, nontender, nondistended. No organomegaly noted, normoactive bowel sounds. Musculoskeletal: Bilateral edema. Neuro: Alert, answering all questions appropriately. Cranial nerves grossly intact. Skin: No rashes or petechiae noted. Psych: Normal affect.   LAB RESULTS:  Lab Results  Component Value Date   NA 141 08/21/2014   K 3.5 08/21/2014   CL 108 08/21/2014   CO2 25 08/21/2014   GLUCOSE 96 08/21/2014   BUN 18 08/21/2014   CREATININE 1.25* 08/21/2014   CALCIUM 9.2 08/21/2014   PROT 7.0 08/20/2014   ALBUMIN 3.7 08/20/2014   AST 19 08/20/2014   ALT 27 08/20/2014   ALKPHOS 72 08/20/2014   BILITOT 0.8 08/20/2014   GFRNONAA 50* 08/21/2014   GFRAA 58* 08/21/2014    Lab Results  Component Value Date   WBC 7.2 08/21/2014   NEUTROABS 3.6 01/17/2014   HGB 12.3 08/21/2014   HCT 38.7 08/21/2014   MCV 81.6 08/21/2014   PLT 198 08/21/2014     STUDIES: Dg  Chest 2 View  08/20/2014   CLINICAL DATA:  CML, receiving oral chemotherapy, shortness of breath  EXAM: CHEST  2 VIEW  COMPARISON:  05/14/2006, 09/25/2012  FINDINGS: Cardiomegaly evident with vascular congestion. Minimal streaky bibasilar atelectasis and  a small right effusion evident. No definite focal pneumonia, collapse or consolidation. No pneumothorax. Trachea midline. Obese body habitus.  IMPRESSION: Cardiomegaly with vascular congestion  Bibasilar atelectasis and trace right effusion   Electronically Signed   By: Jerilynn Mages.  Shick M.D.   On: 08/20/2014 21:46    ASSESSMENT: Fluid overload and increasing shortness of breath. History of CML.  PLAN:    1.  CML: Patient's most recent  BCR/ABL was undetectable. Agree with holding Bosulif during acute illness. Patient has been on this for nearly 2 years, therefore it is unlikely that it would start causing side effects at this point. Patient has been instructed to keep her follow-up appointment this coming Tuesday for further evaluation at which point we will likely reinitiate Bosulif. 2.  Shortness of breath, fluid overload: Treatment per cardiology. Appreciate their input.   Lloyd Huger, MD   08/21/2014 1:09 PM

## 2014-08-21 NOTE — H&P (Signed)
[facility] [place] NAME: Cassidy Gates    MR#:  [id number]  DATE OF BIRTH:  Apr 08, 1965  DATE OF ADMISSION:  08/20/2014  PRIMARY CARE PHYSICIAN: No primary care provider on file. Dr. [name]  REQUESTING/REFERRING PHYSICIAN: Paduchowski  CHIEF COMPLAINT:   Chief Complaint  Patient presents with  . Shortness of Breath    Pt presents to ER alert and in NAD. 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 Pt states SOB with ambulation. Resp even and unlabored. Pt is pale.     HISTORY OF PRESENT ILLNESS:  Cassidy Gates  is a 49 y.o. female with below mentioned past medical history presents to the emergency room with the complaints of ongoing shortness of breath on ambulation which started about 3 months ago and has been progressively worsening since then. Denies any chest pain, palpitations, dizziness, loss of consciousness. Denies any fever, cough, nausea, vomiting, diarrhea, abdominal pain, dysuria. Does have chronic swelling of the legs. She has been followed by a primary care practitioner in his office and has been using increasing doses of furosemide for the past few months and recently furosemide was stopped and she was started on torsemide. One of her oral chemotherapy agent- bosulif was also stopped by her primary care practitioner 4 days ago because of increasing symptoms of shortness of breath with leg swelling. In the emergency room patient was evaluated by the ED physician, found to be comfortable at rest but shortness of breath on ambulation. Workup revealed creatinine of 1.24 which is slightly elevated compared to her baseline of 0.99, elevated BNP of 1811, troponin less than 0.03. Chest x-ray significant for cardiomegaly with vascular congestion and trace right pleural effusion. EKG normal sinus rhythm with ventricular rate of 82 bpm, nonspecific ST-T changes. Patient was given IV furosemide and hospitalist service was consulted for  further management.  PAST MEDICAL HISTORY:   Past Medical History  Diagnosis Date  . Leukemia   . Hypertension   . Diabetes mellitus without complication   . CHF (congestive heart failure)   . OSA (obstructive sleep apnea)     PAST SURGICAL HISTORY:   Past Surgical History  Procedure Laterality Date  . Partial hysterectomy    . Abdominal hysterectomy      SOCIAL HISTORY:   History  Substance Use Topics  . Smoking status: Never Smoker   . Smokeless tobacco: Not on file  . Alcohol Use: No    FAMILY HISTORY:   Family History  Problem Relation Age of Onset  . Breast cancer Mother   . Heart disease Father   . Hypertension Father     DRUG ALLERGIES:   Allergies  Allergen Reactions  . Codeine Hives  . Penicillins Hives    REVIEW OF SYSTEMS:   Review of Systems  Constitutional: Positive for malaise/fatigue. Negative for fever and chills.  HENT: Negative for ear pain, hearing loss, nosebleeds, sore throat and tinnitus.   Eyes: Negative for blurred vision, double vision, pain, discharge and redness.  Respiratory: Positive for shortness of breath. Negative for cough, hemoptysis, sputum production and wheezing.   Cardiovascular: Positive for orthopnea and leg swelling. Negative for chest pain and palpitations.       Exertional shortness of breath ongoing for the past 3 months with increased worsening symptoms lately  Gastrointestinal: Negative for nausea, vomiting, abdominal pain, diarrhea, constipation, blood in stool and melena.  Genitourinary: Negative for dysuria, urgency, frequency and hematuria.  Musculoskeletal: Negative for back pain, joint pain and neck pain.  Skin: Negative for itching and rash.  Neurological: Negative for dizziness, tingling, sensory change, focal weakness and seizures.  Endo/Heme/Allergies: Does not bruise/bleed easily.  Psychiatric/Behavioral: Negative for depression. The patient is not nervous/anxious.     MEDICATIONS AT HOME:    Prior to Admission medications   Medication Sig Start Date End Date Taking? Authorizing Provider  diphenhydrAMINE (SOMINEX) 25 MG tablet Take 25 mg by mouth at bedtime as needed for sleep.   Yes Historical Provider, MD  docusate sodium (COLACE) 100 MG capsule Take 100 mg by mouth daily as needed for mild constipation.   Yes Historical Provider, MD  ferrous sulfate 325 (65 FE) MG tablet Take 325 mg by mouth daily with breakfast.   Yes Historical Provider, MD  hydrALAZINE (APRESOLINE) 50 MG tablet Take 50 mg by mouth 3 (three) times daily.   Yes Historical Provider, MD  levocetirizine (XYZAL) 5 MG tablet Take 5 mg by mouth every evening.   Yes Historical Provider, MD  lovastatin (MEVACOR) 40 MG tablet Take 40 mg by mouth at bedtime.   Yes Historical Provider, MD  metFORMIN (GLUCOPHAGE) 500 MG tablet Take by mouth 2 (two) times daily with a meal.   Yes Historical Provider, MD  metoprolol (LOPRESSOR) 100 MG tablet Take 200 mg by mouth 1 day or 1 dose.   Yes Historical Provider, MD  montelukast (SINGULAIR) 10 MG tablet Take 10 mg by mouth at bedtime.   Yes Historical Provider, MD  ranitidine (ZANTAC) 150 MG tablet Take 150 mg by mouth 2 (two) times daily.   Yes Historical Provider, MD  senna (SENOKOT) 8.6 MG TABS tablet Take 1 tablet by mouth daily as needed for mild constipation.   Yes Historical Provider, MD  torsemide (DEMADEX) 20 MG tablet Take 20 mg by mouth daily.   Yes Historical Provider, MD  valsartan (DIOVAN) 320 MG tablet Take 320 mg by mouth daily.   Yes Historical Provider, MD  vitamin C (ASCORBIC ACID) 500 MG tablet Take 1,000 mg by mouth daily.   Yes Historical Provider, MD  zolpidem (AMBIEN) 10 MG tablet Take 10 mg by mouth at bedtime as needed for sleep.   Yes Historical Provider, MD  bosutinib (BOSULIF) 500 MG tablet Take 500 mg by mouth daily with breakfast. Take with food.    Historical Provider, MD      VITAL SIGNS:  Blood pressure 167/92, pulse 79, temperature 98.1 F (36.7  C), temperature source Oral, resp. rate 22, height 5\' 5"  (1.651 m), weight 129.729 kg (286 lb), SpO2 91 %.  PHYSICAL EXAMINATION:  Physical Exam  Constitutional: She is oriented to person, place, and time. She appears well-developed and well-nourished.  Comfortable at rest but shortness of breath on ambulation  HENT:  Head: Normocephalic and atraumatic.  Right Ear: External ear normal.  Left Ear: External ear normal.  Nose: Nose normal.  Mouth/Throat: Oropharynx is clear and moist. No oropharyngeal exudate.  Eyes: EOM are normal. Pupils are equal, round, and reactive to light. No scleral icterus.  Neck: Normal range of motion. Neck supple. No JVD present. No thyromegaly present.  Cardiovascular: Normal rate, regular rhythm, normal heart sounds and intact distal pulses.  Exam reveals no friction rub.   No murmur heard. Respiratory: Effort normal. No respiratory distress. She has no wheezes. She has rales. She exhibits no tenderness.  GI: Soft. Bowel sounds are normal. She exhibits no distension and no mass. There is no tenderness. There  is no rebound and no guarding.  Musculoskeletal: Normal range of motion. She exhibits edema.  Lymphadenopathy:    She has no cervical adenopathy.  Neurological: She is alert and oriented to person, place, and time. She has normal reflexes. She displays normal reflexes. No cranial nerve deficit. She exhibits normal muscle tone.  Skin: Skin is warm. No rash noted. No erythema.  Psychiatric: She has a normal mood and affect. Her behavior is normal. Thought content normal.   LABORATORY PANEL:   CBC  Recent Labs Lab 08/20/14 2042  WBC 7.6  HGB 11.9*  HCT 36.9  PLT 187   ------------------------------------------------------------------------------------------------------------------  Chemistries   Recent Labs Lab 08/20/14 2042  NA 137  K 3.7  CL 103  CO2 20*  GLUCOSE 118*  BUN 17  CREATININE 1.24*  CALCIUM 9.0  AST 19  ALT 27  ALKPHOS  72  BILITOT 0.8   ------------------------------------------------------------------------------------------------------------------  Cardiac Enzymes  Recent Labs Lab 08/20/14 2042  TROPONINI <0.03   ------------------------------------------------------------------------------------------------------------------  RADIOLOGY:  Dg Chest 2 View  08/20/2014   CLINICAL DATA:  CML, receiving oral chemotherapy, shortness of breath  EXAM: CHEST  2 VIEW  COMPARISON:  05/14/2006, 09/25/2012  FINDINGS: Cardiomegaly evident with vascular congestion. Minimal streaky bibasilar atelectasis and a small right effusion evident. No definite focal pneumonia, collapse or consolidation. No pneumothorax. Trachea midline. Obese body habitus.  IMPRESSION: Cardiomegaly with vascular congestion  Bibasilar atelectasis and trace right effusion   Electronically Signed   By: Jerilynn Mages.  Shick M.D.   On: 08/20/2014 21:46    EKG:   Orders placed or performed during the hospital encounter of 08/20/14  . ED EKG normal sinus rhythm with ventricular rate of 83 bpm, nonspecific T-wave abnormality   . ED EKG    IMPRESSION AND PLAN:   1. Exertional shortness of breath ongoing for the past 2-3 months which gradually worsening for the past 2 weeks-acute congestive heart failure, undetermined type. Plan: Admit to telemetry, O2 supplementation, IV furosemide 60 mg twice a day, cycle cardiac enzymes, order echocardiogram and cardiac consultation for further advice. 2. Acute kidney injury. Creatinine of 1.24 with baseline creatinine of 0.99. Likely secondary to diuretic use. Monitor follow-up BMP closely. Avoid nephrotoxic agents. 3. Hypertension, stable on home medications. Continue same follow BP monitoring closely. 4. Diabetes mellitus type 2 on metformin. Hold metformin for now because of elevated creatinine. Sliding scale insulin for now. Monitor blood sugars closely. 5. Chronic myeloid leukemia on oral chemotherapy- bosulif on  hold since 08/16/14. Patient known to oncology service. Continue care per oncology. 6. Hyperlipidemia, on statin stable continue same. 7. OSA on CPAP, stable, continue same.    All the records are reviewed and case discussed with ED provider. Management plans discussed with the patient, family and they are in agreement.  CODE STATUS: Full code  TOTAL TIME TAKING CARE OF THIS PATIENT: 50 minutes.    Juluis Mire M.D on 08/21/2014 at 1:01 AM  Between 7am to 6pm - Pager - [phone]  After 6pm go to [email or web address] - password [email or web address] [facility]  [facility]  Office  [phone]  CC: Primary care physician; No primary care provider on file.

## 2014-08-21 NOTE — ED Notes (Signed)
Pt alert and in NAD at time of transport on monitor. All belongings given to pt at time of transport.

## 2014-08-21 NOTE — Progress Notes (Signed)
Initial Nutrition Assessment    INTERVENTION:  Education: provided verbal/written education on heart failure nutrition therapy, shopping/cooking tips and low sodium/sodium free flavorings. Discussed label reading, meal planning, daily weighing. Pt very receptive to education, adhearance likely Meals and Snacks: Cater to patient preferences   NUTRITION DIAGNOSIS:   Food and nutrition related knowledge deficit related to limited prior education, chronic illness as evidenced by  (need further CHF).  GOAL:   Patient will meet greater than or equal to 90% of their needs  MONITOR:    (Energy Intake, Anthropometrics, Digestive System, Electrolyte/Renal Profile,)  REASON FOR ASSESSMENT:   Diagnosis    ASSESSMENT:   Pt admitted with SOB with acute CHF  Past Medical History  Diagnosis Date  . CML (chronic myelocytic leukemia)   . Hypertension   . Diabetes mellitus without complication   . Chronic diastolic CHF (congestive heart failure)     a. echo: 06/11/2014: EF >55%, mild MR, AI, GR1DD  . OSA (obstructive sleep apnea)     a. on CPAP  . Obesity     Diet Order:  Diet heart healthy/carb modified Room service appropriate?: Yes; Fluid consistency:: Thin   Energy Intake: recorded po intake 100% at breakfast, appetite good  Diet History: Pt reports appetite has been fairly good at home; reports decreased appetite with chemo but overall has been good  Electrolyte and Renal Profile:  Recent Labs Lab 08/20/14 2042 08/21/14 0257  BUN 17 18  CREATININE 1.24* 1.25*  NA 137 141  K 3.7 3.5   Glucose Profile:  Recent Labs  08/21/14 0920 08/21/14 1122  GLUCAP 132* 122*    Skin:  Reviewed, no issues  Last BM:  7/12  Height:   Ht Readings from Last 1 Encounters:  08/20/14 5\' 5"  (1.651 m)    Weight:    Wt Readings from Last 1 Encounters:  08/21/14 273 lb 3.2 oz (123.923 kg)    Filed Weights   08/20/14 2029 08/21/14 0218  Weight: 286 lb (129.729 kg) 273 lb 3.2  oz (123.923 kg)     Wt Readings from Last 10 Encounters:  08/21/14 273 lb 3.2 oz (123.923 kg)    BMI:  Body mass index is 45.46 kg/(m^2).  EDUCATION NEEDS:   Education needs addressed  LOW Care Level  Kerman Passey MS, [place], LDN [phone] Pager

## 2014-08-21 NOTE — Progress Notes (Signed)
*  PRELIMINARY RESULTS* Echocardiogram 2D Echocardiogram has been performed.  Cassidy Gates 08/21/2014, 8:32 AM

## 2014-08-21 NOTE — Consult Note (Addendum)
Cardiology Consultation Note  Patient ID: Cassidy Gates, MRN: [id number], DOB/AGE: September 11, 1965 49 y.o. Admit date: 08/20/2014   Date of Consult: 08/21/2014 Primary Physician: Dr. [name], MD Primary Cardiologist: New to [facility]  Chief Complaint: SOB Reason for Consult: Acute on chronic diastolic CHF  HPI: 49 y.o. 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   She has been on chemotherapy through [facility] since 2001, on various therapies. Most recently, Bosulif which has has known adverse effects of pleural effusion, pericardial effusion, and edema. This has been managed well in the past with HCTZ, rare chest tube placement and hospitalization, but recently required transition to Lasix 20 mg daily in the setting of fluid retention. She has had several episodes of fluid retention over the years while on her chemotherapy, however no hospitalizations over the past two years. She recently had an echo done 06/11/2014 as part of work up for SOB that showed normal LV function, mild MR, mild AI, GR1DD. Her chemotherapy medication has previously had to be held in the setting of her SOB and volume overload while she is diuresed.   She was recently seen at outside urgent care on 7/3 for increased SOB for the past 2-3 months. At that time her Lasix 20 mg daily was increased to 20 mg tid and she was advised to follow up with her PCP, Dr. [name], MD. Upon her following up with him she was changed to torsemide 20 mg daily. She continued to have SOB, abdominal distension, and lower extremity edema. She does not have a scale at home so does not know what her weight has been. Though her weight at her PCP office on 7/8 was 286 and today her weight is 273. She does drink a fair amount of fluids daily, though she does not think it is greater than 2L daily. She does not apply salt to her foods.   Upon her arrival to [facility] she was found to have  a BNP of 1811, CXR showed cardiomegaly with vascular congestion, right trace pleural effusion, and bibasilar atelectasis. Troponin was negative x 3. SCr 1.24-->1.25, K+3.7-->3.5 (this has been repleted by cardiology), WBC 7.2, hgb 12.3. This morning she reports feeling very cold and has the heat as high as it will go in her room. She has been receiving Lasix 60 mg q 12 hours and is minus 610 mL for the admission so far. She was placed on oxygen via nasal cannula at 2L. Echo today showed EF 60-65%, normal wall motion, GR1DD, left atrium normal size, RV was moderately dilated, wall thickness was normal, systolic function was mildly to moderately reduced, PASP was severely elevated at 65 mm Hg. She is currently on heparin 5,000 units bid.    Past Medical History  Diagnosis Date  . CML (chronic myelocytic leukemia)   . Hypertension   . Diabetes mellitus without complication   . Chronic diastolic CHF (congestive heart failure)     a. echo: 06/11/2014: EF >55%, mild MR, AI, GR1DD  . OSA (obstructive sleep apnea)     a. on CPAP  . Obesity       Most Recent Cardiac Studies: Echo 08/21/2014:  Study Conclusions  - Left ventricle: The cavity size was normal. There was moderate concentric hypertrophy. Systolic function was normal. The estimated ejection fraction was in the range of 60% to 65%. Wall motion was normal; there were no regional wall  motion abnormalities. Doppler parameters are consistent with abnormal left ventricular relaxation (grade 1 diastolic dysfunction). - Left atrium: The atrium was normal in size. - Right ventricle: The cavity size was moderately dilated. Wall thickness was normal. Systolic function was mildly to moderately reduced. - Pulmonary arteries: Systolic pressure was severely elevated. PA peak pressure: 65 mm Hg (S).  Impressions:  - Elevated right heart pressures, dilated RV with reduced RV systolic function with D shaped interventricular  septum consistent with elevated right heart pressures. These findings raise the concern for PE vs acute on chronic diastolic CHF. Murmur likely secondary to LVH and mild outflow tract gradient (not significant though valsalva not performed)   Echo 06/11/2014:  ECHOCARDIOGRAPHIC DESCRIPTIONS  AORTIC ROOT Size:Normal Dissection:INDETERM FOR DISSECTION  AORTIC VALVE Leaflets:Tricuspid Morphology:Normal Mobility:Fully mobile  LEFT VENTRICLE Size:Normal Anterior:Normal Contraction:Normal Lateral:Normal Closest EF:>55% (Estimated) Septal:Normal LV Masses:No Masses Apical:Normal KGM:WNUU Inferior:Normal Posterior:Normal Dias.FxClass:(Grade 1) relaxation abnormal, E/A reversal  MITRAL VALVE Leaflets:Normal Mobility:Fully mobile Morphology:Normal  LEFT ATRIUM Size:MILDLY ENLARGED LA Masses:No masses IA Septum:Normal IAS  MAIN PA Size:Normal  PULMONIC VALVE Morphology:Normal Mobility:Fully mobile  RIGHT VENTRICLE RV Masses:No Masses Size:MILDLY ENLARGED Free Wall:Normal Contraction:Normal  TRICUSPID VALVE Leaflets:Normal Mobility:Fully mobile Morphology:Normal  RIGHT ATRIUM Size:MILDLY ENLARGED RA Other:None RA Mass:No masses  PERICARDIUM Fluid:No effusion  INFERIOR VENACAVA Size:DILATED Normal respiratory collapse ________________________________________________________________ DOPPLER ECHO and OTHER SPECIAL PROCEDURES Aortic:MILD AR No AS  Mitral:MILD MR No MS MV Inflow E Vel=83.4 cm/sec MV Annulus E'Vel=5.3 cm/sec E/E'Ratio=15.8  Tricuspid:MILD TR No TS 406.0 cm/sec peak TR vel 75.9 mmHg peak RV pressure  Pulmonary:No PR No PS _________________________________________________________________________________ INTERPRETATION NORMAL LEFT VENTRICULAR SYSTOLIC FUNCTION NORMAL RIGHT VENTRICULAR SYSTOLIC FUNCTION MILD VALVULAR REGURGITATION (See above) NO VALVULAR STENOSIS   Surgical History:  Past Surgical History  Procedure Laterality Date  .  Partial hysterectomy    . Abdominal hysterectomy       Home Meds: Prior to Admission medications   Medication Sig Start Date End Date Taking? Authorizing Provider  diphenhydrAMINE (SOMINEX) 25 MG tablet Take 25 mg by mouth at bedtime as needed for sleep.   Yes Historical Provider, MD  docusate sodium (COLACE) 100 MG capsule Take 100 mg by mouth daily as needed for mild constipation.   Yes Historical Provider, MD  ferrous sulfate 325 (65 FE) MG tablet Take 325 mg by mouth daily with breakfast.   Yes Historical Provider, MD  hydrALAZINE (APRESOLINE) 50 MG tablet Take 50 mg by mouth 3 (three) times daily.   Yes Historical Provider, MD  levocetirizine (XYZAL) 5 MG tablet Take 5 mg by mouth every evening.   Yes Historical Provider, MD  lovastatin (MEVACOR) 40 MG tablet Take 40 mg by mouth at bedtime.   Yes Historical Provider, MD  metFORMIN (GLUCOPHAGE) 500 MG tablet Take by mouth 2 (two) times daily with a meal.   Yes Historical Provider, MD  metoprolol (LOPRESSOR) 100 MG tablet Take 200 mg by mouth 1 day or 1 dose.   Yes Historical Provider, MD  montelukast (SINGULAIR) 10 MG tablet Take 10 mg by mouth at bedtime.   Yes Historical Provider, MD  ranitidine (ZANTAC) 150 MG tablet Take 150 mg by mouth 2 (two) times daily.   Yes Historical Provider, MD  senna (SENOKOT) 8.6 MG TABS tablet Take 1 tablet by mouth daily as needed for mild constipation.   Yes Historical Provider, MD  torsemide (DEMADEX) 20 MG tablet Take 20 mg by mouth daily.   Yes Historical Provider, MD  valsartan (DIOVAN) 320 MG tablet Take  320 mg by mouth daily.   Yes Historical Provider, MD  vitamin C (ASCORBIC ACID) 500 MG tablet Take 1,000 mg by mouth daily.   Yes Historical Provider, MD  zolpidem (AMBIEN) 10 MG tablet Take 10 mg by mouth at bedtime as needed for sleep.   Yes Historical Provider, MD  bosutinib (BOSULIF) 500 MG tablet Take 500 mg by mouth daily with breakfast. Take with food.    Historical Provider, MD    Inpatient  Medications:  . aspirin EC  81 mg Oral Daily  . cetirizine  5 mg Oral QPM  . famotidine  20 mg Oral Daily  . ferrous sulfate  325 mg Oral Q breakfast  . furosemide  60 mg Intravenous Q12H  . heparin  5,000 Units Subcutaneous 3 times per day  . hydrALAZINE  50 mg Oral TID  . hydrochlorothiazide  25 mg Oral Daily  . insulin aspart  0-9 Units Subcutaneous TID WC  . irbesartan  300 mg Oral Daily  . [START ON 08/22/2014] metoprolol  200 mg Oral 1 day or 1 dose  . montelukast  10 mg Oral QHS  . potassium chloride  20 mEq Oral BID  . potassium chloride  40 mEq Oral Once  . pravastatin  40 mg Oral q1800  . sodium chloride  3 mL Intravenous Q12H  . vitamin C  1,000 mg Oral Daily      Allergies:  Allergies  Allergen Reactions  . Codeine Hives  . Penicillins Hives    History   Social History  . Marital Status: Married    Spouse Name: N/A  . Number of Children: N/A  . Years of Education: N/A   Occupational History  . Not on file.   Social History Main Topics  . Smoking status: Never Smoker   . Smokeless tobacco: Not on file  . Alcohol Use: No  . Drug Use: No  . Sexual Activity: Not on file   Other Topics Concern  . Not on file   Social History Narrative     Family History  Problem Relation Age of Onset  . Breast cancer Mother   . Heart disease Father   . Hypertension Father      Review of Systems: Review of Systems  Constitutional: Positive for chills and malaise/fatigue. Negative for fever, weight loss and diaphoresis.  HENT: Negative for congestion.   Eyes: Negative for blurred vision, discharge and redness.  Respiratory: Positive for shortness of breath. Negative for cough, hemoptysis, sputum production and wheezing.   Cardiovascular: Positive for leg swelling. Negative for chest pain, palpitations and PND.  Gastrointestinal: Negative for heartburn, nausea, vomiting, abdominal pain, blood in stool and melena.  Genitourinary: Negative for hematuria.    Musculoskeletal: Negative for myalgias and falls.  Skin: Negative for rash.  Neurological: Positive for weakness. Negative for dizziness, sensory change, speech change, loss of consciousness and headaches.  Endo/Heme/Allergies: Does not bruise/bleed easily.  Psychiatric/Behavioral: Negative for depression. The patient is not nervous/anxious.   All other systems reviewed and are negative.   Labs:  Recent Labs  08/20/14 2042 08/21/14 0257 08/21/14 0908  TROPONINI <0.03 <0.03 <0.03   Lab Results  Component Value Date   WBC 7.2 08/21/2014   HGB 12.3 08/21/2014   HCT 38.7 08/21/2014   MCV 81.6 08/21/2014   PLT 198 08/21/2014     Recent Labs Lab 08/20/14 2042 08/21/14 0257  NA 137 141  K 3.7 3.5  CL 103 108  CO2 20* 25  BUN 17 18  CREATININE 1.24* 1.25*  CALCIUM 9.0 9.2  PROT 7.0  --   BILITOT 0.8  --   ALKPHOS 72  --   ALT 27  --   AST 19  --   GLUCOSE 118* 96   No results found for: CHOL, HDL, LDLCALC, TRIG No results found for: DDIMER  Radiology/Studies:  Dg Chest 2 View  08/20/2014   CLINICAL DATA:  CML, receiving oral chemotherapy, shortness of breath  EXAM: CHEST  2 VIEW  COMPARISON:  05/14/2006, 09/25/2012  FINDINGS: Cardiomegaly evident with vascular congestion. Minimal streaky bibasilar atelectasis and a small right effusion evident. No definite focal pneumonia, collapse or consolidation. No pneumothorax. Trachea midline. Obese body habitus.  IMPRESSION: Cardiomegaly with vascular congestion  Bibasilar atelectasis and trace right effusion   Electronically Signed   By: Jerilynn Mages.  Shick M.D.   On: 08/20/2014 21:46    EKG: NSR, 83 bpm, TWI III, nonspecific st/t changes leads V2-V6  Weights: [facility] Weights   08/20/14 2029 08/21/14 0218  Weight: 286 lb (129.729 kg) 273 lb 3.2 oz (123.923 kg)     Physical Exam: Blood pressure 161/78, pulse 73, temperature 97.8 F (36.6 C), temperature source Oral, resp. rate 18, height 5\' 5"  (1.651 m), weight 273 lb 3.2 oz  (123.923 kg), SpO2 97 %. Body mass index is 45.46 kg/(m^2). General: Well developed, well nourished, in no acute distress. Room has heat on full blast.  Head: Normocephalic, atraumatic, sclera non-icteric, no xanthomas, nares are without discharge.  Neck: Negative for carotid bruits. JVD not elevated. Lungs: Crackles at bilateral bases, right > left, without wheezes or rhonchi. Breathing is unlabored. Heart: RRR with S1 S2. II/VI systolic murmurs apex. No rubs or gallops appreciated. Abdomen: Obese, soft, non-tender, non-distended with normoactive bowel sounds. No hepatomegaly. No rebound/guarding. No obvious abdominal masses. Msk:  Strength and tone appear normal for age. Extremities: No clubbing or cyanosis. No edema.  Distal pedal pulses are 2+ and equal bilaterally. Neuro: Alert and oriented X 3. No facial asymmetry. No focal deficit. Moves all extremities spontaneously. Psych:  Responds to questions appropriately with a normal affect.    Assessment and Plan:  49 y.o. 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   1. SOB for the past 2-3 months with hypoxia, requiring oxygen supplementation: -Echo with elevated right heart pressures, dilated RV with reduced RVsystolic function with D shaped interventricular septum consistent with elevated right heart pressures. These findings raise the concern for PE vs acute on chronic diastolic CHF. -Consider further evaluation with CTA chest PE protocol or VQ scan to evaluate for PE given her history of CML, symptoms, and echo findings  -Continue IV Lasix 60 mg q 12 hours with potassium repletion and monitoring of renal function  -Bosulif has been held at this time, per primary team/hematology/oncology to decide restarting  -Wean oxygen as tolerated  2. CML: -Bosulif on hold as above -Per primary team -Adverse effects include pleural effusion, pericardial  effusion, and edema   3. Acute renal insufficiency: -Stable today -Continue to monitor while diuresing   4. HTN: -Uncontrolled -Increase hydralazine to 100 mg tid -Continue current medications  5. DM2: -Per IM  6. HLD: -Continue pravastatin   7. OSA: -Would use CPAP from [facility] or her's from home     Signed, Christell Faith, PA-C Pager: [phone] 08/21/2014, 9:50 AM   Attending Note Patient seen  and examined, agree with detailed note above,  Patient presentation and plan discussed on rounds.   Patient presenting with worsening shortness of breath, Symptoms consistent with prior hospital admissions, last per the patient was 2 years ago with diastolic CHF.  Echocardiogram with moderate LVH, diastolic dysfunction, dilated and depressed RV function, D-shaped septum consistent with elevated right heart pressures, biventricular systolic pressure estimated at more than 60  I suspect she has failed Lasix and torsemide 20 mg daily, presenting with worsening symptoms of acute on chronic diastolic CHF Would continue aggressive diuresis with close monitoring of her renal function Suspect she will need CHF clinic, daily weights (patient does not have a scale), At least torsemide 20 g twice a day with potassium twice a day, Extra torsemide for weight gain  Given presentation of her RV size, high pressures, prior cancer history, unable to exclude chronic thromboemboli VQ scan has been ordered In follow-up of this scan, this shows low probability of PE  Unclear if this would definitively exclude chronic from her emboli If in the future, if right heart pressures remain elevated despite aggressive diuresis, could consider chronic anticoagulation She will need close follow-up in clinic  Signed: Esmond Plants  M.D., Ph.D. [facility] heart care

## 2014-08-21 NOTE — Care Management (Signed)
Patient presents from home with chf.  Made referral to CHF clinic.  She is on oral chemotheray for leukemia but this is currently on hold during this exacerbation of heart failure.  Patient does not have chronic 02 but she does have a cpapp machine at home. Provided patient with scales so she can perform daily weights.  She says she does not have financial resources to purchase

## 2014-08-21 NOTE — Progress Notes (Signed)
Patient ID: Cassidy Gates, female   DOB: December 03, 1965, 49 y.o.   MRN: [id number] SUBJECTIVE:  Admitted with progressive edema, orthopnea.  Has been followed for this as outpt; recently with chemo agent stopped due to concern it could be causing fluid retention.  Had echo 5/16 with normal LV fxn.  On hydralazine, irbesartan, toprol; has been on hydralazine for quite some time with no recent dose increase.  Feels better with lasix.  Initial enzymes negative.  ______________________________________________________________________  ROS: Please see HPI; remainder of complete 10 point ROS is negative   Past Medical History  Diagnosis Date  . Leukemia   . Hypertension   . Diabetes mellitus without complication   . CHF (congestive heart failure)   . OSA (obstructive sleep apnea)     Past Surgical History  Procedure Laterality Date  . Partial hysterectomy    . Abdominal hysterectomy       Current facility-administered medications:  .  aspirin EC tablet 81 mg, 81 mg, Oral, Daily, Juluis Mire, MD .  cetirizine (ZYRTEC) tablet 5 mg, 5 mg, Oral, QPM, Lance Coon, MD .  diphenhydrAMINE [facility]) tablet 25 mg, 25 mg, Oral, QHS PRN, Juluis Mire, MD .  docusate sodium (COLACE) capsule 100 mg, 100 mg, Oral, Daily PRN, Juluis Mire, MD .  famotidine (PEPCID) tablet 20 mg, 20 mg, Oral, Daily, Juluis Mire, MD .  ferrous sulfate tablet 325 mg, 325 mg, Oral, Q breakfast, Juluis Mire, MD .  furosemide (LASIX) injection 60 mg, 60 mg, Intravenous, Q12H, Juluis Mire, MD, 60 mg at 08/21/14 0600 .  heparin injection 5,000 Units, 5,000 Units, Subcutaneous, 3 times per day, Juluis Mire, MD, 5,000 Units at 08/21/14 [phone] .  hydrALAZINE (APRESOLINE) tablet 50 mg, 50 mg, Oral, TID, Juluis Mire, MD .  hydrochlorothiazide (HYDRODIURIL) tablet 25 mg, 25 mg, Oral, Daily, Tama High III, MD .  insulin aspart (novoLOG) injection 0-9 Units, 0-9 Units, Subcutaneous, TID WC, Juluis Mire, MD .  irbesartan (AVAPRO) tablet 300 mg, 300 mg, Oral, Daily, Juluis Mire, MD .  Derrill Memo ON 08/22/2014] metoprolol (LOPRESSOR) tablet 200 mg, 200 mg, Oral, 1 day or 1 dose, Juluis Mire, MD .  montelukast (SINGULAIR) tablet 10 mg, 10 mg, Oral, QHS, Juluis Mire, MD .  ondansetron (ZOFRAN) tablet 4 mg, 4 mg, Oral, Q6H PRN **OR** ondansetron (ZOFRAN) injection 4 mg, 4 mg, Intravenous, Q6H PRN, Juluis Mire, MD .  pravastatin (PRAVACHOL) tablet 40 mg, 40 mg, Oral, q1800, Juluis Mire, MD .  senna (SENOKOT) tablet 8.6 mg, 1 tablet, Oral, Daily PRN, Juluis Mire, MD .  sodium chloride 0.9 % injection 3 mL, 3 mL, Intravenous, Q12H, Juluis Mire, MD, 3 mL at 08/21/14 0215 .  vitamin C (ASCORBIC ACID) tablet 1,000 mg, 1,000 mg, Oral, Daily, Juluis Mire, MD .  zolpidem (AMBIEN) tablet 5 mg, 5 mg, Oral, QHS PRN, Juluis Mire, MD  PHYSICAL EXAM:  BP 161/78 mmHg  Pulse 73  Temp(Src) 97.8 F (36.6 C) (Oral)  Resp 18  Ht 5\' 5"  (1.651 m)  Wt 123.923 kg (273 lb 3.2 oz)  BMI 45.46 kg/m2  SpO2 97%  General: morbidly obese  female, in NAD HEENT: PERRL; OP moist without lesions. Neck: supple, trachea midline, no thyromegaly Chest: normal to palpation Lungs: crackles bilaterally without retractions or wheezes Cardiovascular: RRR; distal pulses difficult to palpate Abdomen: soft, nontender, nondistended, positive bowel sounds Extremities: 2+ edema  Neuro: alert, moves all extremities Derm: no significant rashes or nodules; good skin turgor   Labs and imaging studies were reviewed  ASSESSMENT/PLAN:   1. CHF/fluid overload- cont diuresis; add hctz.  Consider reducing/eliminating hydralazine, though with accelerated HTN already.  Will see how BP responds to diuresis.  Repeating echo.  Overnight oximetry to evaluate for OSA. 2. CML.  Will ask hematology to see regarding any chemotx changes needed if holding previous agent 3. CKD stage 3- monitoring renal  fxn 4. AODM.  Cover with SSI if needed

## 2014-08-21 NOTE — ED Notes (Signed)
Pt up to bathroom.

## 2014-08-22 LAB — GLUCOSE, CAPILLARY
GLUCOSE-CAPILLARY: 155 mg/dL — AB (ref 65–99)
Glucose-Capillary: 180 mg/dL — ABNORMAL HIGH (ref 65–99)
Glucose-Capillary: 107 mg/dL — ABNORMAL HIGH (ref 65–99)
Glucose-Capillary: 138 mg/dL — ABNORMAL HIGH (ref 65–99)

## 2014-08-22 LAB — CBC
HEMATOCRIT: 35.7 % (ref 35.0–47.0)
Hemoglobin: 11.7 g/dL — ABNORMAL LOW (ref 12.0–16.0)
MCH: 26.4 pg (ref 26.0–34.0)
MCHC: 32.7 g/dL (ref 32.0–36.0)
MCV: 80.8 fL (ref 80.0–100.0)
Platelets: 194 10*3/uL (ref 150–440)
RBC: 4.41 MIL/uL (ref 3.80–5.20)
RDW: 15.5 % — ABNORMAL HIGH (ref 11.5–14.5)
WBC: 8.7 10*3/uL (ref 3.6–11.0)

## 2014-08-22 LAB — BASIC METABOLIC PANEL
ANION GAP: 12 (ref 5–15)
BUN: 14 mg/dL (ref 6–20)
CHLORIDE: 104 mmol/L (ref 101–111)
CO2: 21 mmol/L — ABNORMAL LOW (ref 22–32)
Calcium: 9.1 mg/dL (ref 8.9–10.3)
Creatinine, Ser: 0.88 mg/dL (ref 0.44–1.00)
GFR calc Af Amer: >60 mL/min (ref >60)
GFR calc non Af Amer: >60 mL/min (ref >60)
GLUCOSE: 125 mg/dL — AB (ref 65–99)
POTASSIUM: 3.8 mmol/L (ref 3.5–5.1)
Sodium: 137 mmol/L (ref 135–145)

## 2014-08-22 MED ORDER — ACETAMINOPHEN 325 MG PO TABS
650.0000 mg | ORAL_TABLET | Freq: Four times a day (QID) | ORAL | Status: DC | PRN
  Administered 2014-08-22: 650 mg via ORAL
  Filled 2014-08-22: qty 2

## 2014-08-22 MED ORDER — ALPRAZOLAM 0.25 MG PO TABS
0.2500 mg | ORAL_TABLET | Freq: Two times a day (BID) | ORAL | Status: DC | PRN
  Administered 2014-08-22 – 2014-08-23 (×3): 0.25 mg via ORAL
  Filled 2014-08-22 (×3): qty 1

## 2014-08-22 MED ORDER — FUROSEMIDE 10 MG/ML IJ SOLN
40.0000 mg | Freq: Two times a day (BID) | INTRAMUSCULAR | Status: DC
  Administered 2014-08-22 (×2): 40 mg via INTRAVENOUS
  Filled 2014-08-22 (×2): qty 4

## 2014-08-22 MED ORDER — HYDRALAZINE HCL 25 MG PO TABS: 25.0000 mg | ORAL_TABLET | Freq: Three times a day (TID) | ORAL | Status: DC

## 2014-08-22 MED FILL — Diphenhydramine HCl Tab 25 MG: ORAL | Qty: 1 | Status: AC

## 2014-08-22 NOTE — Progress Notes (Signed)
Patient ID: Cassidy Gates, female   DOB: 11/28/1965, 49 y.o.   MRN: [id number] SUBJECTIVE: 49y/o f  Admitted with progressive edema, orthopnea.  Has been followed for this as outpt; recently with chemo agent stopped due to concern it could be causing fluid retention.  Had echo 5/16 with normal LV fxn.  On hydralazine, irbesartan, toprol; has been on hydralazine for quite some time with no recent dose increase.  This am did not sleep well. Diuresing well  ______________________________________________________________________  ROS: Please see HPI; remainder of complete 10 point ROS is negative   Past Medical History  Diagnosis Date  . CML (chronic myelocytic leukemia)   . Hypertension   . Diabetes mellitus without complication   . Chronic diastolic CHF (congestive heart failure)     a. echo: 06/11/2014: EF >55%, mild MR, AI, GR1DD  . OSA (obstructive sleep apnea)     a. on CPAP  . Obesity     Past Surgical History  Procedure Laterality Date  . Partial hysterectomy    . Abdominal hysterectomy       Current facility-administered medications:  .  acetaminophen (TYLENOL) tablet 650 mg, 650 mg, Oral, Q6H PRN, Harrie Foreman, MD, 650 mg at 08/22/14 0048 .  aspirin EC tablet 81 mg, 81 mg, Oral, Daily, Juluis Mire, MD, 81 mg at 08/22/14 1004 .  cetirizine (ZYRTEC) tablet 5 mg, 5 mg, Oral, QPM, Lance Coon, MD, 5 mg at 08/21/14 1726 .  diphenhydrAMINE (SOMINEX) tablet 25 mg, 25 mg, Oral, QHS PRN, Juluis Mire, MD, 25 mg at 08/22/14 0255 .  docusate sodium (COLACE) capsule 100 mg, 100 mg, Oral, Daily PRN, Juluis Mire, MD, 100 mg at 08/22/14 1004 .  famotidine (PEPCID) tablet 20 mg, 20 mg, Oral, Daily, Juluis Mire, MD, 20 mg at 08/22/14 1005 .  ferrous sulfate tablet 325 mg, 325 mg, Oral, Q breakfast, Juluis Mire, MD, 325 mg at 08/22/14 1005 .  furosemide (LASIX) injection 40 mg, 40 mg, Intravenous, BID, Ryan M Dunn, PA-C, 40 mg at 08/22/14 1005 .  heparin  injection 5,000 Units, 5,000 Units, Subcutaneous, 3 times per day, Juluis Mire, MD, 5,000 Units at 08/22/14 1409 .  hydrALAZINE (APRESOLINE) tablet 50 mg, 50 mg, Oral, TID, Juluis Mire, MD, 50 mg at 08/22/14 1005 .  hydrochlorothiazide (HYDRODIURIL) tablet 25 mg, 25 mg, Oral, Daily, Tama High III, MD, 25 mg at 08/22/14 1005 .  insulin aspart (novoLOG) injection 0-9 Units, 0-9 Units, Subcutaneous, TID WC, Juluis Mire, MD, 2 Units at 08/22/14 1410 .  irbesartan (AVAPRO) tablet 300 mg, 300 mg, Oral, Daily, Juluis Mire, MD, 300 mg at 08/22/14 1004 .  metoprolol (LOPRESSOR) tablet 200 mg, 200 mg, Oral, 1 day or 1 dose, Juluis Mire, MD, 200 mg at 08/22/14 1005 .  montelukast (SINGULAIR) tablet 10 mg, 10 mg, Oral, QHS, Juluis Mire, MD, 10 mg at 08/21/14 2210 .  ondansetron (ZOFRAN) tablet 4 mg, 4 mg, Oral, Q6H PRN **OR** ondansetron (ZOFRAN) injection 4 mg, 4 mg, Intravenous, Q6H PRN, Juluis Mire, MD .  potassium chloride SA (K-DUR,KLOR-CON) CR tablet 20 mEq, 20 mEq, Oral, BID, Ryan M Dunn, PA-C, 20 mEq at 08/22/14 1005 .  pravastatin (PRAVACHOL) tablet 40 mg, 40 mg, Oral, q1800, Juluis Mire, MD, 40 mg at 08/21/14 1726 .  senna (SENOKOT) tablet 8.6 mg, 1 tablet, Oral, Daily PRN, Juluis Mire, MD, 8.6 mg at 08/22/14 1005 .  sodium chloride  0.9 % injection 3 mL, 3 mL, Intravenous, Q12H, Juluis Mire, MD, 3 mL at 08/21/14 2210 .  vitamin C (ASCORBIC ACID) tablet 1,000 mg, 1,000 mg, Oral, Daily, Juluis Mire, MD, 1,000 mg at 08/22/14 1005 .  zolpidem (AMBIEN) tablet 5 mg, 5 mg, Oral, QHS PRN, Juluis Mire, MD, 5 mg at 08/21/14 2209  PHYSICAL EXAM:  BP 95/50 mmHg  Pulse 72  Temp(Src) 97.8 F (36.6 C) (Oral)  Resp 18  Ht 5\' 5"  (1.651 m)  Wt 124.104 kg (273 lb 9.6 oz)  BMI 45.53 kg/m2  SpO2 97%  General: morbidly obese  female, in NAD HEENT: PERRL; OP moist without lesions. Neck: supple, trachea midline, no thyromegaly Chest: normal to  palpation Lungs: crackles bilaterally without retractions or wheezes Cardiovascular: RRR; distal pulses difficult to palpate Abdomen: soft, nontender, nondistended, positive bowel sounds Extremities: 1 + edema Neuro: alert, moves all extremities Derm: no significant rashes or nodules; good skin turgor   Labs and imaging studies were reviewed  ASSESSMENT/PLAN:   1. Acute Shortness of breath associated with fluid overload secondary to Diastolic CHF:  Continue IV Lasix add hctz.  ECHO: EF 60-85 % with elevated PA pressure. V/q scan; Low probability for PE 2 HTN,:  Hydralazine and Avapro BP is low - Decrease Hydralazine to 25 mg po tid  3 OSA: On CPAP  4  CML.  Holding Bosulif- Dr. Grayland Ormond will evaluate as out pt  5.Acute Renal Failure- Se Creat improved to 0.88 6. AODM.  Cover with SSI if needed 7 Physical Therapy

## 2014-08-22 NOTE — Progress Notes (Signed)
Patient: Cassidy Gates / Admit Date: 08/20/2014 / Date of Encounter: 08/22/2014, 7:44 AM   Subjective: Increased SOB overnight requiring 3L via [place]. Improving this AM. VQ scan low risk for acute PE. Minus 1.4L for admission. Lasix held on 7/13 given SCr, which is now improved to 0.88.   Review of Systems: Review of Systems  Constitutional: Positive for malaise/fatigue. Negative for fever, chills, weight loss and diaphoresis.  HENT: Negative for congestion.   Eyes: Negative for blurred vision, discharge and redness.  Respiratory: Positive for shortness of breath. Negative for cough, hemoptysis, sputum production and wheezing.   Cardiovascular: Positive for leg swelling. Negative for chest pain, palpitations, orthopnea, claudication and PND.  Gastrointestinal: Negative for heartburn, nausea, vomiting, blood in stool and melena.  Genitourinary: Negative for hematuria.  Musculoskeletal: Negative for myalgias and falls.  Skin: Negative for rash.  Neurological: Positive for weakness. Negative for sensory change, speech change and headaches.  Endo/Heme/Allergies: Does not bruise/bleed easily.  Psychiatric/Behavioral: Negative for depression. The patient is not nervous/anxious.   All other systems reviewed and are negative.     Objective: Telemetry: sinus tach, low 100's Physical Exam: Blood pressure 143/68, pulse 108, temperature 98.1 F (36.7 C), temperature source Oral, resp. rate 18, height 5\' 5"  (1.651 m), weight 273 lb 9.6 oz (124.104 kg), SpO2 92 %. Body mass index is 45.53 kg/(m^2). General: Well developed, well nourished, in no acute distress. Head: Normocephalic, atraumatic, sclera non-icteric, no xanthomas, nares are without discharge. Neck: Negative for carotid bruits. JVP not elevated. Lungs: Faint crackle right base without wheezes or rhonchi. Breathing is unlabored. Heart: RRR S1 S2. II/VI systolic murmur apex. No rubs or gallops.  Abdomen: Obese, soft, non-tender,  non-distended with normoactive bowel sounds. No rebound/guarding. Extremities: No clubbing or cyanosis. No edema. Distal pedal pulses are 2+ and equal bilaterally. Neuro: Alert and oriented X 3. Moves all extremities spontaneously. Psych:  Responds to questions appropriately with a normal affect.   Intake/Output Summary (Last 24 hours) at 08/22/14 0744 Last data filed at 08/22/14 0630  Gross per 24 hour  Intake    723 ml  Output   1600 ml  Net   -877 ml    Inpatient Medications:  . aspirin EC  81 mg Oral Daily  . cetirizine  5 mg Oral QPM  . famotidine  20 mg Oral Daily  . ferrous sulfate  325 mg Oral Q breakfast  . heparin  5,000 Units Subcutaneous 3 times per day  . hydrALAZINE  50 mg Oral TID  . hydrochlorothiazide  25 mg Oral Daily  . insulin aspart  0-9 Units Subcutaneous TID WC  . irbesartan  300 mg Oral Daily  . metoprolol  200 mg Oral 1 day or 1 dose  . montelukast  10 mg Oral QHS  . potassium chloride  20 mEq Oral BID  . pravastatin  40 mg Oral q1800  . sodium chloride  3 mL Intravenous Q12H  . vitamin C  1,000 mg Oral Daily   Infusions:    Labs:  Recent Labs  08/21/14 0257 08/22/14 0436  NA 141 137  K 3.5 3.8  CL 108 104  CO2 25 21*  GLUCOSE 96 125*  BUN 18 14  CREATININE 1.25* 0.88  CALCIUM 9.2 9.1    Recent Labs  08/20/14 2042  AST 19  ALT 27  ALKPHOS 72  BILITOT 0.8  PROT 7.0  ALBUMIN 3.7    Recent Labs  08/21/14 0257 08/22/14 0436  WBC 7.2 8.7  HGB 12.3 11.7*  HCT 38.7 35.7  MCV 81.6 80.8  PLT 198 194    Recent Labs  08/20/14 2042 08/21/14 0257 08/21/14 0908 08/21/14 1608  TROPONINI <0.03 <0.03 <0.03 <0.03   Invalid input(s): POCBNP No results for input(s): HGBA1C in the last 72 hours.   Weights: Filed Weights   08/20/14 2029 08/21/14 0218 08/22/14 0510  Weight: 286 lb (129.729 kg) 273 lb 3.2 oz (123.923 kg) 273 lb 9.6 oz (124.104 kg)     Radiology/Studies:  Dg Chest 2 View  08/20/2014   CLINICAL DATA:  CML,  receiving oral chemotherapy, shortness of breath  EXAM: CHEST  2 VIEW  COMPARISON:  05/14/2006, 09/25/2012  FINDINGS: Cardiomegaly evident with vascular congestion. Minimal streaky bibasilar atelectasis and a small right effusion evident. No definite focal pneumonia, collapse or consolidation. No pneumothorax. Trachea midline. Obese body habitus.  IMPRESSION: Cardiomegaly with vascular congestion  Bibasilar atelectasis and trace right effusion   Electronically Signed   By: Jerilynn Mages.  Shick M.D.   On: 08/20/2014 21:46   Nm Pulmonary Perf And Vent  08/21/2014   CLINICAL DATA:  Short of breath  EXAM: NUCLEAR MEDICINE VENTILATION - PERFUSION LUNG SCAN  TECHNIQUE: Ventilation images were obtained in multiple projections using inhaled aerosol Tc-41m DTPA. Perfusion images were obtained in multiple projections after intravenous injection of Tc-37m MAA.  RADIOPHARMACEUTICALS:  42.8 mCi Technetium-37m DTPA aerosol inhalation and 4.31 mCi Technetium-91m MAA IV  COMPARISON:  Chest x-ray 08/20/2014  FINDINGS: Ventilation: Elevated right hemidiaphragm is noted on chest x-ray with mild right lower lobe atelectasis in diffusion. No segmental perfusion defects are identified. Overall perfusion as fever defects and ventilation.  Perfusion: Elevated right hemidiaphragm. Patchy ventilation bilaterally with some DTPA aggregation.  IMPRESSION: Low probability for pulmonary emboli.  Elevated right hemidiaphragm.   Electronically Signed   By: Franchot Gallo M.D.   On: 08/21/2014 15:22     Assessment and Plan  49 y.o. female with h/o CML on Bosulif, diastolic dysfunction, DM2, HTN, obesity, and OSA on CPAP who presented to [facility] on 7/12 with recurrent SOB.   1. SOB for the past 2-3 months with hypoxia, requiring oxygen supplementation: -Echo with elevated right heart pressures, dilated RV with reduced RVsystolic function with D shaped interventricular septum consistent with elevated right heart pressures.  -VQ showed low probability  for acute PE, uncertain if this would exclude chronic PE -Should her symptoms persist could further evaluate with CTA chest (SCr permitting) vs empiric anticoagulation  -She did not receive any Lasix on 7/13, improved Scr this morning to 0.88 -Restart IV Lasix 40 mg q 12 hours with potassium repletion and monitoring of renal function -Plan to transition to torsemide 20 mg bid at discharge with potassium repletion   -Bosulif has been held at this time, per primary team/hematology/oncology to decide restarting  -Wean oxygen as tolerated  2. CML: -Bosulif on hold as above -Per primary team -Adverse effects include pleural effusion, pericardial effusion, and edema   3. Acute renal insufficiency: -Resolved -Continue to monitor while diuresing   4. HTN: -Improved -Restart Lasix as above -Continue current medications  5. DM2: -Per IM  6. HLD: -Continue pravastatin   7. OSA: -Would use CPAP from [facility] or her's from home   Signed, Christell Faith, PA-C Pager: [phone] 08/22/2014, 7:44 AM

## 2014-08-22 NOTE — Plan of Care (Signed)
Problem: Phase II Progression Outcomes Goal: Other Phase II Outcomes/Goals Outcome: Completed/Met Date Met:  08/22/14 Pt is alert and oriented x 4, pt did not sleep well over night increasing anxiety/ restlessness, xanax given prn which improved patients condition. Fair appetite, continues on 3 L oxygen, complains of SOB, receiving IV lasix, up with one assist, hydralazine held bc of drop in blood pressure, uneventful shift.

## 2014-08-22 NOTE — Progress Notes (Signed)
Notified Dr. [name] via telephone that pt wants something for her nerves. Per MD okay to order 0.25 mg bid xanax prn anxiety.

## 2014-08-23 ENCOUNTER — Telehealth: Payer: Self-pay

## 2014-08-23 LAB — CBC WITH DIFFERENTIAL/PLATELET
BASOS PCT: 1 %
Basophils Absolute: 0 10*3/uL (ref 0–0.1)
Eosinophils Absolute: 0.3 10*3/uL (ref 0–0.7)
Eosinophils Relative: 5 %
HCT: 33.9 % — ABNORMAL LOW (ref 35.0–47.0)
HEMOGLOBIN: 10.9 g/dL — AB (ref 12.0–16.0)
Lymphocytes Relative: 32 %
Lymphs Abs: 1.8 10*3/uL (ref 1.0–3.6)
MCH: 26.2 pg (ref 26.0–34.0)
MCHC: 32.1 g/dL (ref 32.0–36.0)
MCV: 81.5 fL (ref 80.0–100.0)
Monocytes Absolute: 1.1 10*3/uL — ABNORMAL HIGH (ref 0.2–0.9)
Monocytes Relative: 20 %
NEUTROS ABS: 2.4 10*3/uL (ref 1.4–6.5)
NEUTROS PCT: 42 %
PLATELETS: 177 10*3/uL (ref 150–440)
RBC: 4.16 MIL/uL (ref 3.80–5.20)
RDW: 14.9 % — ABNORMAL HIGH (ref 11.5–14.5)
WBC: 5.7 10*3/uL (ref 3.6–11.0)

## 2014-08-23 LAB — BASIC METABOLIC PANEL
Anion gap: 11 (ref 5–15)
BUN: 17 mg/dL (ref 6–20)
CO2: 25 mmol/L (ref 22–32)
CREATININE: 1.1 mg/dL — AB (ref 0.44–1.00)
Calcium: 8.9 mg/dL (ref 8.9–10.3)
Chloride: 103 mmol/L (ref 101–111)
GFR calc non Af Amer: 59 mL/min — ABNORMAL LOW (ref >60)
Glucose, Bld: 100 mg/dL — ABNORMAL HIGH (ref 65–99)
Potassium: 3.5 mmol/L (ref 3.5–5.1)
Sodium: 139 mmol/L (ref 135–145)

## 2014-08-23 LAB — GLUCOSE, CAPILLARY
Glucose-Capillary: 102 mg/dL — ABNORMAL HIGH (ref 65–99)
Glucose-Capillary: 148 mg/dL — ABNORMAL HIGH (ref 65–99)

## 2014-08-23 MED ORDER — FUROSEMIDE 10 MG/ML IJ SOLN
20.0000 mg | Freq: Two times a day (BID) | INTRAMUSCULAR | Status: DC
  Administered 2014-08-23: 20 mg via INTRAVENOUS
  Filled 2014-08-23: qty 2

## 2014-08-23 MED ORDER — POTASSIUM CHLORIDE CRYS ER 20 MEQ PO TBCR: 20.0000 meq | EXTENDED_RELEASE_TABLET | Freq: Two times a day (BID) | ORAL | Status: AC

## 2014-08-23 NOTE — Progress Notes (Signed)
   08/23/14 1258 08/23/14 1259 08/23/14 1300  Oxygen Therapy  SpO2 90 % (!) 85 % 93 %  O2 Device Room Air (At rest ) Room Air (with excertion) Nasal Cannula  O2 Flow Rate (L/min) --  --  2 L/min

## 2014-08-23 NOTE — Telephone Encounter (Signed)
Patient contacted regarding discharge from [facility] on 08/23/14.  Patient understands to follow up with Christell Faith, PA on 08/29/14 at 2:00 at [facility]. Patient understands discharge instructions? yes Patient understands medications and regiment? yes Patient understands to bring all medications to this visit? yes

## 2014-08-23 NOTE — Progress Notes (Signed)
IV and tele dc'd, reviewed dc instructions and home meds, rx given to patient, f/u appts reviewed with patient, 02 tank supplied and given to patient, no questions verbalized, escorted by staff to visitors entrance

## 2014-08-23 NOTE — Care Management (Signed)
Patient did qualify for home 02.  Referral to [facility]  Confirmed that [facility] will see [facility] 7/16.  Patient is in total agreement with discharge and plan of care.

## 2014-08-23 NOTE — Care Management Important Message (Signed)
Important Message  Patient Details  Name: Cassidy Gates MRN: [id number] Date of Birth: November 18, 1965   Medicare Important Message Given:  Yes-second notification given    Juliann Pulse A Allmond 08/23/2014, 10:39 AM

## 2014-08-23 NOTE — Care Management (Signed)
Patient is to discharge home today and agreeable to home health follow up.  Does not have agency preference.  Referral to [facility] for nursing/ telehealth.  She does have referral to CHF clinic.  Awaiting 02 sats from primary nurse to determine if patient is going to need home oxygen.  Attending to place order for home health/face to face and 02 if it is found that it will be needed.  If not, CM with discontinue.

## 2014-08-23 NOTE — Progress Notes (Signed)
Patient ID: Cassidy Gates, female   DOB: January 14, 1966, 49 y.o.   MRN: [id number] SUBJECTIVE: 49y/o f  Admitted with progressive edema, orthopnea.  Has been followed for this as outpt; recently with chemo agent stopped due to concern it could be causing fluid retention.  Had echo 5/16 with normal LV fxn.  On hydralazine, irbesartan, toprol;  This am feels better   ______________________________________________________________________  ROS: Please see HPI; remainder of complete 10 point ROS is negative   Past Medical History  Diagnosis Date  . CML (chronic myelocytic leukemia)   . Hypertension   . Diabetes mellitus without complication   . Chronic diastolic CHF (congestive heart failure)     a. echo: 06/11/2014: EF >55%, mild MR, AI, GR1DD  . OSA (obstructive sleep apnea)     a. on CPAP  . Obesity     Past Surgical History  Procedure Laterality Date  . Partial hysterectomy    . Abdominal hysterectomy       Current facility-administered medications:  .  acetaminophen (TYLENOL) tablet 650 mg, 650 mg, Oral, Q6H PRN, Harrie Foreman, MD, 650 mg at 08/22/14 0048 .  ALPRAZolam (XANAX) tablet 0.25 mg, 0.25 mg, Oral, BID PRN, Arvella Massingale, MD, 0.25 mg at 08/23/14 1019 .  aspirin EC tablet 81 mg, 81 mg, Oral, Daily, Juluis Mire, MD, 81 mg at 08/23/14 1014 .  cetirizine (ZYRTEC) tablet 5 mg, 5 mg, Oral, QPM, Lance Coon, MD, 5 mg at 08/22/14 1828 .  diphenhydrAMINE (SOMINEX) tablet 25 mg, 25 mg, Oral, QHS PRN, Juluis Mire, MD, 25 mg at 08/22/14 0255 .  docusate sodium (COLACE) capsule 100 mg, 100 mg, Oral, Daily PRN, Juluis Mire, MD, 100 mg at 08/22/14 1004 .  famotidine (PEPCID) tablet 20 mg, 20 mg, Oral, Daily, Juluis Mire, MD, 20 mg at 08/23/14 1014 .  ferrous sulfate tablet 325 mg, 325 mg, Oral, Q breakfast, Juluis Mire, MD, 325 mg at 08/23/14 1014 .  furosemide (LASIX) injection 20 mg, 20 mg, Intravenous, BID, Ryan M Dunn, PA-C, 20 mg at 08/23/14 1015 .   heparin injection 5,000 Units, 5,000 Units, Subcutaneous, 3 times per day, Juluis Mire, MD, 5,000 Units at 08/23/14 0630 .  insulin aspart (novoLOG) injection 0-9 Units, 0-9 Units, Subcutaneous, TID WC, Juluis Mire, MD, 2 Units at 08/22/14 1410 .  irbesartan (AVAPRO) tablet 300 mg, 300 mg, Oral, Daily, Juluis Mire, MD, 300 mg at 08/23/14 1014 .  metoprolol (LOPRESSOR) tablet 200 mg, 200 mg, Oral, 1 day or 1 dose, Juluis Mire, MD, 200 mg at 08/23/14 1014 .  montelukast (SINGULAIR) tablet 10 mg, 10 mg, Oral, QHS, Juluis Mire, MD, 10 mg at 08/22/14 2122 .  ondansetron (ZOFRAN) tablet 4 mg, 4 mg, Oral, Q6H PRN **OR** ondansetron (ZOFRAN) injection 4 mg, 4 mg, Intravenous, Q6H PRN, Juluis Mire, MD .  potassium chloride SA (K-DUR,KLOR-CON) CR tablet 20 mEq, 20 mEq, Oral, BID, Ryan M Dunn, PA-C, 20 mEq at 08/23/14 1014 .  pravastatin (PRAVACHOL) tablet 40 mg, 40 mg, Oral, q1800, Juluis Mire, MD, 40 mg at 08/22/14 1828 .  senna (SENOKOT) tablet 8.6 mg, 1 tablet, Oral, Daily PRN, Juluis Mire, MD, 8.6 mg at 08/22/14 1005 .  sodium chloride 0.9 % injection 3 mL, 3 mL, Intravenous, Q12H, Juluis Mire, MD, 3 mL at 08/23/14 1016 .  vitamin C (ASCORBIC ACID) tablet 1,000 mg, 1,000 mg, Oral, Daily, Juluis Mire, MD, 1,000 mg  at 08/23/14 1014 .  zolpidem (AMBIEN) tablet 5 mg, 5 mg, Oral, QHS PRN, Juluis Mire, MD, 5 mg at 08/22/14 2122  PHYSICAL EXAM:  BP 131/62 mmHg  Pulse 66  Temp(Src) 97.4 F (36.3 C) (Oral)  Resp 17  Ht 5\' 5"  (1.651 m)  Wt 124.013 kg (273 lb 6.4 oz)  BMI 45.50 kg/m2  SpO2 97%  General: morbidly obese  female, in NAD HEENT: PERRL; OP moist without lesions. Neck: supple, trachea midline, no thyromegaly Chest: normal to palpation Lungs: crackles bilaterally without retractions or wheezes Cardiovascular: RRR; distal pulses difficult to palpate Abdomen: soft, nontender, nondistended, positive bowel sounds Extremities: 1 +  edema Neuro: alert, moves all extremities Derm: no significant rashes or nodules; good skin turgor   Labs and imaging studies were reviewed  ASSESSMENT/PLAN:   1. Acute Shortness of breath associated with fluid overload secondary to Diastolic CHF:  Continue IV Lasix  ECHO: EF 60-85 % with elevated PA pressure. V/q scan; Low probability for PE Change to torsemide as out pt  Ambulate today and see if O2 can be weaned  2 HTN,:Continue   Hydralazine and Avapro 3 OSA: On CPAP  4  CML.  Holding Bosulif- Dr. Grayland Ormond will evaluate as out pt  5.Acute Renal Failure- Se Creat is 1.10 6. AODM.  Cover with SSI if needed 7 Physical Therapy For possible d/c later today

## 2014-08-23 NOTE — Plan of Care (Signed)
Problem: Consults Goal: Heart Failure Patient Education (See Patient Education module for education specifics.)  Outcome: Progressing Pt BP 120-150/60-90s, HR 60-80s NSR, Pt on 3L [place] SpO2 > 92%. Pt slept well throughout the night with the help of Ambien and Xanax.

## 2014-08-23 NOTE — Telephone Encounter (Signed)
-----   Message from Clarisse Gouge sent at 08/23/2014  2:44 PM EDT ----- Regarding: TCM/PH D/c [facility] 08/23/14 dx SOB CHF Fu 1 week   Scheduled with Ryan on 08/29/14 at 2:00 pm

## 2014-08-23 NOTE — Care Management (Signed)
Discussed during progression the need to check patient for possible need for home 02.  There have been no room air sats documented in the vital sign flow sheets.

## 2014-08-23 NOTE — Discharge Summary (Signed)
Physician Discharge Summary  Cassidy Gates MBT:[id number] DOB: 05/27/1965 DOA: 08/20/2014  PCP: No primary care provider on file.  Admit date: 08/20/2014 Discharge date: 08/23/2014  Time spent: 45  minutes  Recommendations for Outpatient Follow-up:  1 Follow up with Dr. Fletcher Anon- Cardiologist. 2 Follow up with Dr. Grayland Ormond- Oncologist. 3 Follow up with Dr. [name]- PCP  Discharge Diagnoses:   1  Acute  Respiratory failure with Hypoxemia secondary to Diastolic CHF (congestive heart failure) 2  HTN (hypertension) 3  DM2 (diabetes mellitus, type 2) 4  AKI (acute kidney injury) 5 CML (chronic myelocytic leukemia) 6  OSA (obstructive sleep apnea) 7  Morbid obesity   Discharge Condition: Stable   Diet recommendation:  1800 cals, 2 gm sodium   Filed Weights   08/21/14 0218 08/22/14 0510 08/23/14 0436  Weight: 123.923 kg (273 lb 3.2 oz) 124.104 kg (273 lb 9.6 oz) 124.013 kg (273 lb 6.4 oz)    History of present illness:  Cassidy Gates is a 49 year old female, with a history of CML hypertension type 2 diabetes CHF obstructive sleep apnea syndrome-presented to ED complaining of worsening shortness of breath and leg edema. She had been seen in the Coumadin clinic a few days prior and her Michelle Piper was stop, because of retention of pericardial effusion as a possible side effect. In the ED patient's BNP was noted elevated 1811 and serum creatinine was elevated 1.24.   Hospital Course:   Patient was admitted [facility] and received intravenous Lasix. She diabetes well and her shortness of breath also improved. VQ scan was read as low probability for PE Echocardiogram showed EF of 60-65% and moderate concentric LVH Patient was ambulated and noted to to be hypoxemic and home O2 at 2 L nasal cannula was recommended. Patient was advised to call the clinic with any questions or concerns advised to continue her CPAP. She has been advised follow-up with cardiologist Dr.Arida   also follow me Dr.Ilayda Toda  also follow up with Dr. Grayland Ormond oncologist    Consultations: Carduiologist- Dr. Fletcher Anon   Discharge Exam: Filed Vitals:   08/23/14 1111  BP: 131/62  Pulse: 66  Temp: 97.4 F (36.3 C)  Resp: 17    NAD General: Obese  Cardiovascular: S1 S2 Respiratory: Clear to auscultation  Discharge Instructions   Discharge Instructions    AMB referral to CHF clinic    Complete by:  As directed      Diet - low sodium heart healthy    Complete by:  As directed      Discharge instructions    Complete by:  As directed   D/c Home today. Home Health Nursing and PT  Call office to make appt in 1-2 weeks     For home use only DME oxygen    Complete by:  As directed   Mode or (Route):  Nasal cannula  Liters per Minute:  2  Frequency:  Continuous (stationary and portable oxygen unit needed)  Oxygen conserving device:  Yes  Oxygen delivery system:  Gas          Current Discharge Medication List    START taking these medications   Details  potassium chloride SA (K-DUR,KLOR-CON) 20 MEQ tablet Take 1 tablet (20 mEq total) by mouth 2 (two) times daily. Qty: 30 tablet, Refills: 3      CONTINUE these medications which have NOT CHANGED   Details  diphenhydrAMINE (SOMINEX) 25 MG tablet Take 25 mg by mouth at bedtime as needed for sleep.  docusate sodium (COLACE) 100 MG capsule Take 100 mg by mouth daily as needed for mild constipation.    ferrous sulfate 325 (65 FE) MG tablet Take 325 mg by mouth daily with breakfast.    levocetirizine (XYZAL) 5 MG tablet Take 5 mg by mouth every evening.    lovastatin (MEVACOR) 40 MG tablet Take 40 mg by mouth at bedtime.    metFORMIN (GLUCOPHAGE) 500 MG tablet Take by mouth 2 (two) times daily with a meal.    metoprolol (LOPRESSOR) 100 MG tablet Take 200 mg by mouth 1 day or 1 dose.    montelukast (SINGULAIR) 10 MG tablet Take 10 mg by mouth at bedtime.    ranitidine (ZANTAC) 150 MG tablet Take 150 mg by mouth 2 (two)  times daily.    senna (SENOKOT) 8.6 MG TABS tablet Take 1 tablet by mouth daily as needed for mild constipation.    torsemide (DEMADEX) 20 MG tablet Take 20 mg by mouth daily.    valsartan (DIOVAN) 320 MG tablet Take 320 mg by mouth daily.    vitamin C (ASCORBIC ACID) 500 MG tablet Take 1,000 mg by mouth daily.    zolpidem (AMBIEN) 10 MG tablet Take 10 mg by mouth at bedtime as needed for sleep.      STOP taking these medications     hydrALAZINE (APRESOLINE) 50 MG tablet      bosutinib (BOSULIF) 500 MG tablet        Allergies  Allergen Reactions  . Codeine Hives  . Penicillins Hives      The results of significant diagnostics from this hospitalization (including imaging, microbiology, ancillary and laboratory) are listed below for reference.    Significant Diagnostic Studies: Dg Chest 2 View  08/20/2014   CLINICAL DATA:  CML, receiving oral chemotherapy, shortness of breath  EXAM: CHEST  2 VIEW  COMPARISON:  05/14/2006, 09/25/2012  FINDINGS: Cardiomegaly evident with vascular congestion. Minimal streaky bibasilar atelectasis and a small right effusion evident. No definite focal pneumonia, collapse or consolidation. No pneumothorax. Trachea midline. Obese body habitus.  IMPRESSION: Cardiomegaly with vascular congestion  Bibasilar atelectasis and trace right effusion   Electronically Signed   By: Jerilynn Mages.  Shick M.D.   On: 08/20/2014 21:46   Nm Pulmonary Perf And Vent  08/21/2014   CLINICAL DATA:  Short of breath  EXAM: NUCLEAR MEDICINE VENTILATION - PERFUSION LUNG SCAN  TECHNIQUE: Ventilation images were obtained in multiple projections using inhaled aerosol Tc-51m DTPA. Perfusion images were obtained in multiple projections after intravenous injection of Tc-67m MAA.  RADIOPHARMACEUTICALS:  42.8 mCi Technetium-34m DTPA aerosol inhalation and 4.31 mCi Technetium-55m MAA IV  COMPARISON:  Chest x-ray 08/20/2014  FINDINGS: Ventilation: Elevated right hemidiaphragm is noted on chest x-ray  with mild right lower lobe atelectasis in diffusion. No segmental perfusion defects are identified. Overall perfusion as fever defects and ventilation.  Perfusion: Elevated right hemidiaphragm. Patchy ventilation bilaterally with some DTPA aggregation.  IMPRESSION: Low probability for pulmonary emboli.  Elevated right hemidiaphragm.   Electronically Signed   By: Franchot Gallo M.D.   On: 08/21/2014 15:22    Microbiology: No results found for this or any previous visit (from the past 240 hour(s)).   Labs: Basic Metabolic Panel:  Recent Labs Lab 08/20/14 2042 08/21/14 0257 08/22/14 0436 08/23/14 0327  NA 137 141 137 139  K 3.7 3.5 3.8 3.5  CL 103 108 104 103  CO2 20* 25 21* 25  GLUCOSE 118* 96 125* 100*  BUN 17 18 14  17  CREATININE 1.24* 1.25* 0.88 1.10*  CALCIUM 9.0 9.2 9.1 8.9   Liver Function Tests:  Recent Labs Lab 08/20/14 2042  AST 19  ALT 27  ALKPHOS 72  BILITOT 0.8  PROT 7.0  ALBUMIN 3.7   No results for input(s): LIPASE, AMYLASE in the last 168 hours. No results for input(s): AMMONIA in the last 168 hours. CBC:  Recent Labs Lab 08/20/14 2042 08/21/14 0257 08/22/14 0436 08/23/14 0327  WBC 7.6 7.2 8.7 5.7  NEUTROABS  --   --   --  2.4  HGB 11.9* 12.3 11.7* 10.9*  HCT 36.9 38.7 35.7 33.9*  MCV 81.5 81.6 80.8 81.5  PLT 187 198 194 177   Cardiac Enzymes:  Recent Labs Lab 08/20/14 2042 08/21/14 0257 08/21/14 0908 08/21/14 1608  TROPONINI <0.03 <0.03 <0.03 <0.03   BNP: BNP (last 3 results)  Recent Labs  08/11/14 1451 08/20/14 2041  BNP 597.0* 1811.0*    ProBNP (last 3 results) No results for input(s): PROBNP in the last 8760 hours.  CBG:  Recent Labs Lab 08/22/14 1211 08/22/14 1633 08/22/14 1956 08/23/14 0735 08/23/14 1132  GLUCAP 180* 107* 138* 102* 148*       Signed:  Tyrann Donaho   08/23/2014, 1:15 PM

## 2014-08-23 NOTE — Progress Notes (Addendum)
I have seen and examined the patient. I agree with the above note with the addition of :  She feels significantly better after diuresis. Her lungs are more clear cardiac exam. Leg edema improved. I recommend changing IV furosemide to torsemide 20 mg by mouth twice daily. The patient can be discharged home from a cardiac standpoint if she is not hypoxic on room air. Please note that both hydrochlorothiazide and hydralazine were discontinued due to hypotension noted yesterday. Will arrange for follow-up in our clinic in 2-3 weeks.  Kathlyn Sacramento MD, [facility] 08/23/2014 12:34 PM     Patient: Cassidy Gates / Admit Date: 08/20/2014 / Date of Encounter: 08/23/2014, 10:21 AM   Subjective: SOB continued to improve this AM, continued on O2 via [place]. Able to walk from bed to bathroom without losing breath or getting dizzy, states the heaviness in her legs and stomach has resolved.  Has not ambulated with PT in the halls yet. VQ scan low risk for acute PE. Minus 1.8L for admission. Lasix held on 7/13 given SCr of 1.25, which is now improved to 1.10, 20 mg IV Lasix ordered BID for 7/15.    Review of Systems: Review of Systems  Constitutional: Positive for malaise/fatigue. Negative for fever, chills and weight loss.       Tired from not sleeping well last night, would sleep better at home without medical personnel coming in  HENT: Negative for congestion.   Eyes: Negative for blurred vision, discharge and redness.  Respiratory: Negative for cough, sputum production, shortness of breath and wheezing.   Cardiovascular: Negative for chest pain, palpitations, orthopnea, leg swelling and PND.  Gastrointestinal: Negative for nausea, vomiting and abdominal pain.  Musculoskeletal: Positive for back pain. Negative for falls.       Back pain from laying in bed for several days  Skin: Negative for rash.  Neurological: Negative for dizziness, tingling, focal weakness, loss of consciousness, weakness and headaches.   Psychiatric/Behavioral: The patient does not have insomnia.   All other systems reviewed and are negative.     Objective: Telemetry: NSR, upper 80's Physical Exam: Blood pressure 120/68, pulse 64, temperature 97.4 F (36.3 C), temperature source Oral, resp. rate 22, height 5\' 5"  (1.651 m), weight 273 lb 6.4 oz (124.013 kg), SpO2 97 %. Body mass index is 45.5 kg/(m^2). General: Well developed, well nourished, in no acute distress, sitting up in bed watching tv, eating. Head: Normocephalic, atraumatic, sclera non-icteric, no xanthomas, nares are without discharge. Neck: Negative for carotid bruits. JVP not elevated. Lungs: Faint crackles right base without wheezes or rhonchi. Breathing is unlabored. Heart: RRR S1 S2. II/VI systolic murmur apex. No rubs or gallops. Abdomen: Obese, soft, non-tender, non-distended with normoactive bowel sounds. No rebound/guarding. Extremities: No clubbing or cyanosis. No edema. Distal pedal pulses are 2+ and equal bilaterally. Neuro: Alert and oriented X 3. Moves all extremities spontaneously. Psych:  Responds to questions appropriately with a normal affect.   Intake/Output Summary (Last 24 hours) at 08/23/14 1021 Last data filed at 08/23/14 0849  Gross per 24 hour  Intake    600 ml  Output   1000 ml  Net   -400 ml    Inpatient Medications:  . aspirin EC  81 mg Oral Daily  . cetirizine  5 mg Oral QPM  . famotidine  20 mg Oral Daily  . ferrous sulfate  325 mg Oral Q breakfast  . furosemide  20 mg Intravenous BID  . heparin  5,000 Units Subcutaneous  3 times per day  . insulin aspart  0-9 Units Subcutaneous TID WC  . irbesartan  300 mg Oral Daily  . metoprolol  200 mg Oral 1 day or 1 dose  . montelukast  10 mg Oral QHS  . potassium chloride  20 mEq Oral BID  . pravastatin  40 mg Oral q1800  . sodium chloride  3 mL Intravenous Q12H  . vitamin C  1,000 mg Oral Daily   Infusions:    Labs:  Recent Labs  08/22/14 0436 08/23/14 0327  NA 137  139  K 3.8 3.5  CL 104 103  CO2 21* 25  GLUCOSE 125* 100*  BUN 14 17  CREATININE 0.88 1.10*  CALCIUM 9.1 8.9    Recent Labs  08/20/14 2042  AST 19  ALT 27  ALKPHOS 72  BILITOT 0.8  PROT 7.0  ALBUMIN 3.7    Recent Labs  08/22/14 0436 08/23/14 0327  WBC 8.7 5.7  NEUTROABS  --  2.4  HGB 11.7* 10.9*  HCT 35.7 33.9*  MCV 80.8 81.5  PLT 194 177    Recent Labs  08/20/14 2042 08/21/14 0257 08/21/14 0908 08/21/14 1608  TROPONINI <0.03 <0.03 <0.03 <0.03   Invalid input(s): POCBNP No results for input(s): HGBA1C in the last 72 hours.   Weights: Filed Weights   08/21/14 0218 08/22/14 0510 08/23/14 0436  Weight: 273 lb 3.2 oz (123.923 kg) 273 lb 9.6 oz (124.104 kg) 273 lb 6.4 oz (124.013 kg)     Radiology/Studies:  Dg Chest 2 View  08/20/2014   CLINICAL DATA:  CML, receiving oral chemotherapy, shortness of breath  EXAM: CHEST  2 VIEW  COMPARISON:  05/14/2006, 09/25/2012  FINDINGS: Cardiomegaly evident with vascular congestion. Minimal streaky bibasilar atelectasis and a small right effusion evident. No definite focal pneumonia, collapse or consolidation. No pneumothorax. Trachea midline. Obese body habitus.  IMPRESSION: Cardiomegaly with vascular congestion  Bibasilar atelectasis and trace right effusion   Electronically Signed   By: Jerilynn Mages.  Shick M.D.   On: 08/20/2014 21:46   Nm Pulmonary Perf And Vent  08/21/2014   CLINICAL DATA:  Short of breath  EXAM: NUCLEAR MEDICINE VENTILATION - PERFUSION LUNG SCAN  TECHNIQUE: Ventilation images were obtained in multiple projections using inhaled aerosol Tc-51m DTPA. Perfusion images were obtained in multiple projections after intravenous injection of Tc-76m MAA.  RADIOPHARMACEUTICALS:  42.8 mCi Technetium-70m DTPA aerosol inhalation and 4.31 mCi Technetium-64m MAA IV  COMPARISON:  Chest x-ray 08/20/2014  FINDINGS: Ventilation: Elevated right hemidiaphragm is noted on chest x-ray with mild right lower lobe atelectasis in diffusion. No  segmental perfusion defects are identified. Overall perfusion as fever defects and ventilation.  Perfusion: Elevated right hemidiaphragm. Patchy ventilation bilaterally with some DTPA aggregation.  IMPRESSION: Low probability for pulmonary emboli.  Elevated right hemidiaphragm.   Electronically Signed   By: Franchot Gallo M.D.   On: 08/21/2014 15:22     Assessment and Plan  49 y.o. female with h/o CML on Bosulif, diastolic dysfunction, DM2, HTN, obesity, and OSA on CPAP who presented to [facility] on 7/12 with recurrent SOB.   1. SOB for the past 2-3 months with hypoxia, requiring oxygen supplementation: -Echo with elevated right heart pressures, dilated RV with reduced RVsystolic function with D shaped interventricular septum consistent with elevated right heart pressures.  -VQ showed low probability for acute PE, uncertain if this would exclude chronic PE -Should her symptoms persist could further evaluate with CTA chest (SCr permitting) vs empiric anticoagulation  -She did  not receive any Lasix on 7/13 given SCr of 1.25. Received IV Lasix 40 mg bid on 7/14 with follow up SCr on 7/15 of 1.10 demonstrating a slightly tenuous renal function  -Decreased Lasix to 20 mg IV q12 hours for 7/15 with potassium repletion and monitoring of renal function -Plan to transition to torsemide 20 mg daily with prn dosing for weight gain of >3 pounds nightly or >5 pounds weekly. Should she still remain SOB with this dosing could pursue bid dosing with close monitoring of renal function if SCr permits  -Bosulif has been held at this time, per primary team/hematology/oncology to decide restarting  -Wean oxygen as tolerated, currently not experiencing SOB and not wearing [place]  -Work with PT  2. CML: -Bosulif on hold as above -Per primary team -Adverse effects include pleural effusion, pericardial effusion, and edema   3. Acute renal insufficiency: -Tenuous  -Continue to monitor while diuresing   4.  HTN: -Labile - Lasix as above -Continue current medications  5. DM2: -Per IM  6. HLD: -Continue pravastatin   7. OSA: -Would use CPAP from [facility] or her's from home  8. Dispo: -Will need follow up with our office next week   Signed, Christell Faith, PA-C Pager: [phone] 08/23/2014, 10:21 AM

## 2014-08-23 NOTE — Discharge Instructions (Signed)
[place] [phone]

## 2014-08-24 ENCOUNTER — Encounter: Payer: Self-pay | Admitting: Physician Assistant

## 2014-08-24 DIAGNOSIS — I5032 Chronic diastolic (congestive) heart failure: Secondary | ICD-10-CM | POA: Insufficient documentation

## 2014-08-27 ENCOUNTER — Inpatient Hospital Stay: Payer: Medicare Other | Attending: Oncology | Admitting: Oncology

## 2014-08-27 ENCOUNTER — Inpatient Hospital Stay: Payer: Medicare Other

## 2014-08-27 ENCOUNTER — Ambulatory Visit: Payer: Medicare Other | Admitting: Oncology

## 2014-08-27 VITALS — BP 150/92 | HR 60 | Temp 95.9°F | Resp 20 | Wt 266.0 lb

## 2014-08-27 DIAGNOSIS — R609 Edema, unspecified: Secondary | ICD-10-CM | POA: Insufficient documentation

## 2014-08-27 DIAGNOSIS — Z803 Family history of malignant neoplasm of breast: Secondary | ICD-10-CM | POA: Diagnosis not present

## 2014-08-27 DIAGNOSIS — I517 Cardiomegaly: Secondary | ICD-10-CM | POA: Diagnosis not present

## 2014-08-27 DIAGNOSIS — I10 Essential (primary) hypertension: Secondary | ICD-10-CM | POA: Insufficient documentation

## 2014-08-27 DIAGNOSIS — C9210 Chronic myeloid leukemia, BCR/ABL-positive, not having achieved remission: Secondary | ICD-10-CM

## 2014-08-27 DIAGNOSIS — K5900 Constipation, unspecified: Secondary | ICD-10-CM

## 2014-08-27 DIAGNOSIS — E119 Type 2 diabetes mellitus without complications: Secondary | ICD-10-CM | POA: Diagnosis not present

## 2014-08-27 DIAGNOSIS — I509 Heart failure, unspecified: Secondary | ICD-10-CM | POA: Insufficient documentation

## 2014-08-27 DIAGNOSIS — Z90710 Acquired absence of both cervix and uterus: Secondary | ICD-10-CM | POA: Diagnosis not present

## 2014-08-27 DIAGNOSIS — E669 Obesity, unspecified: Secondary | ICD-10-CM | POA: Insufficient documentation

## 2014-08-27 DIAGNOSIS — R252 Cramp and spasm: Secondary | ICD-10-CM | POA: Insufficient documentation

## 2014-08-27 DIAGNOSIS — J9811 Atelectasis: Secondary | ICD-10-CM | POA: Diagnosis not present

## 2014-08-27 DIAGNOSIS — G4730 Sleep apnea, unspecified: Secondary | ICD-10-CM | POA: Insufficient documentation

## 2014-08-27 DIAGNOSIS — R011 Cardiac murmur, unspecified: Secondary | ICD-10-CM | POA: Diagnosis not present

## 2014-08-27 DIAGNOSIS — Z79899 Other long term (current) drug therapy: Secondary | ICD-10-CM | POA: Insufficient documentation

## 2014-08-27 DIAGNOSIS — R0602 Shortness of breath: Secondary | ICD-10-CM | POA: Diagnosis not present

## 2014-08-27 LAB — COMPREHENSIVE METABOLIC PANEL
ALBUMIN: 3.6 g/dL (ref 3.5–5.0)
ALK PHOS: 74 U/L (ref 38–126)
ALT: 26 U/L (ref 14–54)
ANION GAP: 10 (ref 5–15)
AST: 18 U/L (ref 15–41)
BUN: 24 mg/dL — AB (ref 6–20)
CHLORIDE: 106 mmol/L (ref 101–111)
CO2: 24 mmol/L (ref 22–32)
CREATININE: 1.11 mg/dL — AB (ref 0.44–1.00)
Calcium: 9.1 mg/dL (ref 8.9–10.3)
GFR, EST NON AFRICAN AMERICAN: 58 mL/min — AB (ref >60)
Glucose, Bld: 102 mg/dL — ABNORMAL HIGH (ref 65–99)
Potassium: 4 mmol/L (ref 3.5–5.1)
Sodium: 140 mmol/L (ref 135–145)
TOTAL PROTEIN: 6.9 g/dL (ref 6.5–8.1)
Total Bilirubin: 0.6 mg/dL (ref 0.3–1.2)

## 2014-08-27 LAB — CBC WITH DIFFERENTIAL/PLATELET
BASOS ABS: 0.1 10*3/uL (ref 0–0.1)
Basophils Relative: 1 %
EOS PCT: 5 %
Eosinophils Absolute: 0.3 10*3/uL (ref 0–0.7)
HEMATOCRIT: 34.9 % — AB (ref 35.0–47.0)
HEMOGLOBIN: 10.9 g/dL — AB (ref 12.0–16.0)
LYMPHS ABS: 1.8 10*3/uL (ref 1.0–3.6)
Lymphocytes Relative: 34 %
MCH: 25.8 pg — AB (ref 26.0–34.0)
MCHC: 31.3 g/dL — ABNORMAL LOW (ref 32.0–36.0)
MCV: 82.3 fL (ref 80.0–100.0)
Monocytes Absolute: 0.8 10*3/uL (ref 0.2–0.9)
Monocytes Relative: 16 %
Neutro Abs: 2.3 10*3/uL (ref 1.4–6.5)
Neutrophils Relative %: 44 %
PLATELETS: 216 10*3/uL (ref 150–440)
RBC: 4.24 MIL/uL (ref 3.80–5.20)
RDW: 15 % — AB (ref 11.5–14.5)
WBC: 5.2 10*3/uL (ref 3.6–11.0)

## 2014-08-27 NOTE — Progress Notes (Signed)
Patient was having significant edema and dyspnea so she presented to ER then was admitted.

## 2014-08-29 ENCOUNTER — Ambulatory Visit (INDEPENDENT_AMBULATORY_CARE_PROVIDER_SITE_OTHER): Payer: Medicare Other | Admitting: Physician Assistant

## 2014-08-29 ENCOUNTER — Encounter: Payer: Self-pay | Admitting: Physician Assistant

## 2014-08-29 VITALS — BP 145/82 | HR 59 | Ht 65.0 in | Wt 268.8 lb

## 2014-08-29 DIAGNOSIS — E1159 Type 2 diabetes mellitus with other circulatory complications: Secondary | ICD-10-CM

## 2014-08-29 DIAGNOSIS — J9610 Chronic respiratory failure, unspecified whether with hypoxia or hypercapnia: Secondary | ICD-10-CM | POA: Insufficient documentation

## 2014-08-29 DIAGNOSIS — N179 Acute kidney failure, unspecified: Secondary | ICD-10-CM | POA: Diagnosis not present

## 2014-08-29 DIAGNOSIS — J9611 Chronic respiratory failure with hypoxia: Secondary | ICD-10-CM

## 2014-08-29 DIAGNOSIS — G4733 Obstructive sleep apnea (adult) (pediatric): Secondary | ICD-10-CM

## 2014-08-29 DIAGNOSIS — C9210 Chronic myeloid leukemia, BCR/ABL-positive, not having achieved remission: Secondary | ICD-10-CM

## 2014-08-29 DIAGNOSIS — I5032 Chronic diastolic (congestive) heart failure: Secondary | ICD-10-CM

## 2014-08-29 DIAGNOSIS — R0602 Shortness of breath: Secondary | ICD-10-CM

## 2014-08-29 DIAGNOSIS — I10 Essential (primary) hypertension: Secondary | ICD-10-CM

## 2014-08-29 DIAGNOSIS — C9290 Myeloid leukemia, unspecified, not having achieved remission: Secondary | ICD-10-CM

## 2014-08-29 NOTE — Progress Notes (Signed)
Cardiology Hospital Follow Up Note:   Date of Encounter: 08/29/2014  ID: Cassidy Gates, DOB January 10, 1966, MRN [id number]  PCP: Cassidy Harrier, MD Primary Cardiologist: [name], MD  Chief Complaint  Patient presents with  . other    F/u from Hospital c/o edema feet. Meds reviewed verbally with pt.     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   She has been on chemotherapy through [facility] since 2001, on various therapies. Most recently, Bosulif which has has known adverse effects of pleural effusion, pericardial effusion, and edema. Throughout the course of her CML management she has required occasional hospitlization, and at time chest tube placement 2/2 effusions, though none in the psat two years. Her volume status has been managed well in the past with HCTZ, but recently required transition to Lasix 20 mg daily in the setting of increased fluid retention. She recently had an echo done 06/11/2014 as part of work up for SOB that showed normal LV function, mild MR, mild AI, GR1DD. Her chemotherapy medication has previously had to be held in the setting of her SOB and volume overload while she is diuresed.   She was recently seen at outside urgent care on 7/3 for increased SOB for the past 2-3 months. At that time her Lasix 20 mg daily was increased to 20 mg tid and she was advised to follow up with her PCP, Dr. [name], MD. Upon her following up with him she was changed to torsemide 20 mg daily. She continued to have SOB, abdominal distension, and lower extremity edema. She does not have a scale at home so does not know what her weight has been. Though her weight at her PCP office on 7/8 was  286 and upon admission was 286. She does drink a fair amount of fluids daily, though she does not think it is greater than 2L daily. She does not apply salt to her foods.   Upon her arrival to [facility] she was found to have a BNP of 1811, CXR showed cardiomegaly with vascular congestion, right trace pleural effusion, and bibasilar atelectasis. Troponin was negative x 3. Renal function was elevated upon admission with a SCr 1.24-->1.25, K+3.7-->3.5, WBC 7.2, hgb 12.3. Echo showed EF 60-65%, normal wall motion, GR1DD, left atrium normal size, RV was moderately dilated, wall thickness was normal, systolic function was mildly to moderately reduced, PASP was severely elevated at 65 mm Hg. Given this echo report and her symptoms a PE needed to be ruled out. Her renal function at that time limited her ability to have a CTA of the chest, thus she underwent a VQ scan which was low probability for PE. On hospital day one her Lasix was held 2/2 her renal function which did improve to 0.8 and she was restarted on IV Lasix 40 mg bid. Follow up Scr on hospital day 3 was found to be 1.1. She was ultimately discharged on torsemide 20 mg daily and advised to take an extra dose prn weight gain of >3pounds nightly or >5 pounds weekly. She was seen by the heart failure clinic and given a digital scale. She was advised to pick up a BP cuff.  Since discharge, she has  been wearing her oxygen 24 hours a day at 2L via [place]. She has not experienced any shortness of breath since leaving [facility]. She has at-home PT and has worked with them on 7/17 and 7/21 without difficulty. She is interested in regaining her strength and returning to a more active lifestyle. She denies chest pain, leg swelling or pain, abdominal fullness or pain, cough, headache, or dizziness. She currently sleeps on 2 pillows at night without raising the head of the bed which is at her baseline, but is unable to use her CPAP at night because the tubing was lost when she left  [facility]. She does not check her blood pressure at home but is planning to get a blood pressure cuff to keep track of her blood pressure. She is agreeable to taking her weight daily to monitor for signs of fluid retention, and is very interested in preventative measures to help avoid future hospitalizations. She has been taking her medications as prescribed at home and is feeling much better than when she entered the hospital.    Past Medical History  Diagnosis Date  . CML (chronic myelocytic leukemia)     a. on Bosulif (adverse effects include pericardial efusion, pleural effusion, and edema)   . Hypertension   . DM2 (diabetes mellitus, type 2)   . Chronic diastolic CHF (congestive heart failure)     a. echo: 06/11/2014: EF >55%, mild MR, AI, GR1DD; b. echo 08/2014: EF 60-65%, no RWMA, GR1DD, LA nl, RV mod dilated, wall thickness nl, sys fxn mild to mod reduced, PASP severely elevated 65 mm Hg  . OSA (obstructive sleep apnea)     a. on CPAP  . Obesity   . Heart murmur   : Past Surgical History  Procedure Laterality Date  . Partial hysterectomy    . Abdominal hysterectomy    : Family History  Problem Relation Age of Onset  . Breast cancer Mother   . Heart disease Father   . Hypertension Father   :  reports that she has never smoked. She does not have any smokeless tobacco history on file. She reports that she does not drink alcohol or use illicit drugs.:   Allergies:  Allergies  Allergen Reactions  . Codeine Hives  . Penicillins Hives     Home Medications:  Current Outpatient Prescriptions  Medication Sig Dispense Refill  . docusate sodium (COLACE) 100 MG capsule Take 100 mg by mouth daily as needed for mild constipation.    . ferrous sulfate 325 (65 FE) MG tablet Take 325 mg by mouth daily with breakfast.    . hydrALAZINE (APRESOLINE) 50 MG tablet Take 50 mg by mouth 2 (two) times daily.     Marland Kitchen levocetirizine (XYZAL) 5 MG tablet Take 5 mg by mouth every evening.    .  lovastatin (MEVACOR) 40 MG tablet Take 40 mg by mouth at bedtime.    . metFORMIN (GLUCOPHAGE) 500 MG tablet Take 500 mg by mouth daily with breakfast.     . metoprolol (LOPRESSOR) 100 MG tablet Take 200 mg by mouth 1 day or 1 dose.    . montelukast (SINGULAIR) 10 MG tablet Take 10 mg by mouth at bedtime.    . NON FORMULARY Oxygen 2 liter.    . potassium chloride SA (K-DUR,KLOR-CON) 20 MEQ tablet Take 1 tablet (20 mEq total) by mouth 2 (two) times daily. 30 tablet 3  . ranitidine (ZANTAC) 150 MG tablet Take 150 mg by mouth 2 (two) times daily.    Marland Kitchen  senna (SENOKOT) 8.6 MG TABS tablet Take 1 tablet by mouth daily as needed for mild constipation.    . torsemide (DEMADEX) 20 MG tablet Take 20 mg by mouth daily.    . valsartan (DIOVAN) 320 MG tablet Take 320 mg by mouth daily.    . vitamin C (ASCORBIC ACID) 500 MG tablet Take 1,000 mg by mouth daily.    Marland Kitchen zolpidem (AMBIEN) 10 MG tablet Take 10 mg by mouth at bedtime as needed for sleep.     No current facility-administered medications for this visit.     Review of Systems:  Review of Systems  Constitutional: Positive for malaise/fatigue. Negative for fever, chills, weight loss and diaphoresis.  HENT: Negative for congestion.   Eyes: Negative for blurred vision, discharge and redness.  Respiratory: Negative for cough, hemoptysis, sputum production, shortness of breath and wheezing.        Has not stopped her 2L O2 by [place] since she was discharged home  Cardiovascular: Negative for chest pain, palpitations, orthopnea, claudication, leg swelling and PND.  Gastrointestinal: Negative for nausea, vomiting, abdominal pain, diarrhea and constipation.  Musculoskeletal: Negative for falls.  Skin: Negative for rash.  Neurological: Positive for weakness. Negative for dizziness, tingling, sensory change, speech change, focal weakness, loss of consciousness and headaches.  Endo/Heme/Allergies: Does not bruise/bleed easily.  Psychiatric/Behavioral: Positive  for depression. The patient is not nervous/anxious.        Dealing with the use of home O2 and having to take Bosulif that can lead to fluid overload causes depression sometimes  All other systems reviewed and are negative.    Physical Exam:  Blood pressure 145/82, pulse 59, height 5\' 5"  (1.651 m), weight 268 lb 12 oz (121.904 kg). BMI: Body mass index is 44.72 kg/(m^2). General: Very pleasant African American female, NAD, sitting in chair using O2 via nasal cannula  Psych: Normal affect. Responds to questions with normal affect.  Neuro: Alert and oriented X 3. Moves all extremities spontaneously. HEENT: Normocephalic, atraumatic. EOM intact bilaterally. Sclera anicteric.  Neck: Trachea midline. Supple without bruits or JVD. Lungs:  Respirations regular and unlabored, CTA bilaterally without wheezing, crackles, or rhonchi.  Heart: RRR, normal s3, s4. No murmurs, rubs, or gallops.  Abdomen: Soft, non-tender, non-distended, BS + x 4.  Extremities: No clubbing, cyanosis or edema. DP/PT/Radials 2+ and equal bilaterally.  Accessory Clinical Findings:  EKG: Sinus bradycardia, 59 BPM; poor R wave progression; TWI leads III, V2, and V3; T wave flattening leads V4-V6  Recent Labs: 08/20/2014: B Natriuretic Peptide 1811.0* 08/27/2014: ALT 26; BUN 24*; Creatinine, Ser 1.11*; Hemoglobin 10.9*; Platelets 216; Potassium 4.0; Sodium 140     Component Value Date/Time   CHOL 165 09/27/2012 0636   TRIG 83 09/27/2012 0636   HDL 70* 09/27/2012 0636   VLDL 17 09/27/2012 0636   LDLCALC 78 09/27/2012 0636    Weights: Wt Readings from Last 3 Encounters:  08/29/14 268 lb 12 oz (121.904 kg)  08/27/14 266 lb (120.657 kg)  08/23/14 273 lb 6.4 oz (124.013 kg)    Estimated Creatinine Clearance: 82.1 mL/min (by C-G formula based on Cr of 1.11).   Other studies Reviewed: Additional studies/ records that were reviewed today include: prior notes.   Assessment & Plan:  49 y.o. female with h/o CML on  Bosulif, diastolic dysfunction, DM2, HTN, obesity, and OSA on CPAP who presented to [facility] on 7/12 with acute on chronic diastolic CHF and pulmonary HTN.   1. Chronic respiratory failure requiring 2L via nasal  cannula: -Likely secondary to her chronic diastolic CHF and pulmonary HTN possibly in the setting of Bosulif  -Much improved from her hospitalization, continue to wean as tolerated   2. Chronic diastolic CHF/pulmonary HTN: -Improved -Echo with elevated right heart pressures, dilated RV with reduced RVsystolic function with D shaped interventricular septum consistent with elevated right heart pressures.  -VQ showed low probability for acute PE -Should her symptoms persist could further evaluate with CTA chest (SCr permitting) vs empiric anticoagulation -Could also pursue right and left heart caths when she is stable to evaluate for ischemia as potential etiologic agent for the above and to gauge right heart pressure to guide treatment. She currently would not be able to tolerate ischemic evaluation given her fatigability and SOB   -She seems quite disheartened with not knowing what to do should her symptoms return given the restarting of Bosulif. Advised patient to continue torsemide 20 mg daily with prn dosing for weight gain of > 3 pounds nightly or >5 pounds weekly, SOB, or LEE  -Bosulif has been restarted per primary team/hematology/oncology on 08/27/14, must watch for adverse effects  -Wean oxygen as tolerated, currently not experiencing SOB but is wearing 2L of O2 via [place] 24 hours a day at home since discharge -Working with PT at home without difficulty - visits on 7/17 and 7/21 -She would benefit from cardiac rehab when she is able   2. CML: -Bosulif restarted as above -Adverse effects include pleural effusion, pericardial effusion, and edema -Pt aware of side effects and plan for torsemide dosing if side effects occur  3. Acute renal insufficiency: -Resolved -SCr on 7/19 was  1.1  4. HTN: -Is currently taking her medications as prescribed -Does not take BP readings at home but has plans to purchase a BP cuff to be able to do so -BP at this OV was 145/82  5. DM2: -Is currently taking her medications as prescribed -Last A1c on 08/27/14 was 5.4  6. HLD: -Taking lovastatin as prescribed -Lipid panel on 08/27/14 under Care Everywhere was unremarkable   7. OSA: -Wears CPAP at home -Has been unable to wear CPAP since discharge because tubing was lost at [place] has been prescribed   Dispo: - Follow up with [facility] in 3 months unless a need for an appointment arises earlier  Current medicines are reviewed at length with the patient today.  The patient did not have any concerns regarding medicines.   Christell Faith, PA-C [address] [place], [place] [address] [phone] [facility] 08/29/2014, 3:42 PM

## 2014-08-29 NOTE — Patient Instructions (Signed)
Medication Instructions:  Your physician has recommended you make the following change in your medication:  CONTINUE to take Torsemide 20mg  daily TAKE EXTRA Torsemide for shortness of breath or weight gain greater than 3lbs in a day or 5lbs in a week   Labwork: none  Testing/Procedures: none  Follow-Up: Your physician recommends that you schedule a follow-up appointment in: 3 months with Christell Faith, PA-C or Dr. Rockey Situ.    Any Other Special Instructions Will Be Listed Below (If Applicable).

## 2014-09-04 ENCOUNTER — Encounter: Payer: Self-pay | Admitting: Family

## 2014-09-04 ENCOUNTER — Ambulatory Visit: Payer: Medicare Other | Attending: Family | Admitting: Family

## 2014-09-04 VITALS — BP 122/70 | HR 75 | Resp 20 | Ht 65.0 in | Wt 267.0 lb

## 2014-09-04 DIAGNOSIS — E669 Obesity, unspecified: Secondary | ICD-10-CM | POA: Insufficient documentation

## 2014-09-04 DIAGNOSIS — Z79899 Other long term (current) drug therapy: Secondary | ICD-10-CM | POA: Diagnosis not present

## 2014-09-04 DIAGNOSIS — J9610 Chronic respiratory failure, unspecified whether with hypoxia or hypercapnia: Secondary | ICD-10-CM | POA: Insufficient documentation

## 2014-09-04 DIAGNOSIS — R011 Cardiac murmur, unspecified: Secondary | ICD-10-CM | POA: Insufficient documentation

## 2014-09-04 DIAGNOSIS — E1159 Type 2 diabetes mellitus with other circulatory complications: Secondary | ICD-10-CM

## 2014-09-04 DIAGNOSIS — I10 Essential (primary) hypertension: Secondary | ICD-10-CM | POA: Diagnosis not present

## 2014-09-04 DIAGNOSIS — I5032 Chronic diastolic (congestive) heart failure: Secondary | ICD-10-CM | POA: Diagnosis present

## 2014-09-04 DIAGNOSIS — C9210 Chronic myeloid leukemia, BCR/ABL-positive, not having achieved remission: Secondary | ICD-10-CM | POA: Diagnosis not present

## 2014-09-04 DIAGNOSIS — G4733 Obstructive sleep apnea (adult) (pediatric): Secondary | ICD-10-CM | POA: Diagnosis not present

## 2014-09-04 DIAGNOSIS — E119 Type 2 diabetes mellitus without complications: Secondary | ICD-10-CM | POA: Insufficient documentation

## 2014-09-04 NOTE — Progress Notes (Signed)
Subjective:    Patient ID: Cassidy Gates, female    DOB: [birth year], 49 y.o.   MRN: [id number]  Congestive Heart Failure Presents for initial visit. The disease course has been improving. Associated symptoms include chest pressure (tightness with exercise), edema, fatigue and shortness of breath. Pertinent negatives include no abdominal pain, chest pain, orthopnea, palpitations or paroxysmal nocturnal dyspnea. The symptoms have been improving. Past treatments include angiotensin receptor blockers, beta blockers, salt and fluid restriction and oxygen. The treatment provided moderate relief. Compliance with prior treatments has been good. Her past medical history is significant for DM and HTN. Compliance with total regimen is 76-100%.  Other This is a chronic (edema) problem. The current episode started more than 1 month ago. The problem occurs daily. The problem has been gradually improving. Associated symptoms include coughing (dry), fatigue and headaches. Pertinent negatives include no abdominal pain, chest pain, congestion, neck pain, sore throat or visual change. The symptoms are aggravated by walking and standing. She has tried position changes for the symptoms. The treatment provided moderate relief.    Past Medical History  Diagnosis Date  . CML (chronic myelocytic leukemia)     a. on Bosulif (adverse effects include pericardial efusion, pleural effusion, and edema)   . Hypertension   . DM2 (diabetes mellitus, type 2)   . Chronic diastolic CHF (congestive heart failure)     a. echo: 06/11/2014: EF >55%, mild MR, AI, GR1DD; b. echo 08/2014: EF 60-65%, no RWMA, GR1DD, LA nl, RV mod dilated, wall thickness nl, sys fxn mild to mod reduced, PASP severely elevated 65 mm Hg  . OSA (obstructive sleep apnea)     a. on CPAP  . Obesity   . Heart murmur   . Chronic respiratory failure    Past Surgical History  Procedure Laterality Date  . Partial hysterectomy    . Abdominal hysterectomy      Family History  Problem Relation Age of Onset  . Breast cancer Mother   . Heart disease Father   . Hypertension Father   . Hypertension Sister   . Thyroid disease Sister   . Hypertension Brother   . Diabetes Brother     History  Substance Use Topics  . Smoking status: Never Smoker   . Smokeless tobacco: Never Used  . Alcohol Use: No    Allergies  Allergen Reactions  . Codeine Hives  . Penicillins Hives    Prior to Admission medications   Medication Sig Start Date End Date Taking? Authorizing Provider  docusate sodium (COLACE) 100 MG capsule Take 100 mg by mouth daily as needed for mild constipation.   Yes Historical Provider, MD  ferrous sulfate 325 (65 FE) MG tablet Take 325 mg by mouth daily with breakfast.   Yes Historical Provider, MD  hydrALAZINE (APRESOLINE) 50 MG tablet Take 50 mg by mouth 2 (two) times daily.  08/22/14  Yes Historical Provider, MD  levocetirizine (XYZAL) 5 MG tablet Take 5 mg by mouth every evening.   Yes Historical Provider, MD  lovastatin (MEVACOR) 40 MG tablet Take 40 mg by mouth at bedtime.   Yes Historical Provider, MD  metFORMIN (GLUCOPHAGE) 500 MG tablet Take 500 mg by mouth daily with breakfast.    Yes Historical Provider, MD  metoprolol (LOPRESSOR) 100 MG tablet Take 200 mg by mouth 1 day or 1 dose.   Yes Historical Provider, MD  montelukast (SINGULAIR) 10 MG tablet Take 10 mg by mouth at bedtime.   Yes Historical Provider,  MD  NON FORMULARY Oxygen 2 liter.   Yes Historical Provider, MD  potassium chloride SA (K-DUR,KLOR-CON) 20 MEQ tablet Take 1 tablet (20 mEq total) by mouth 2 (two) times daily. 08/23/14  Yes Vishwanath Hande, MD  ranitidine (ZANTAC) 150 MG tablet Take 150 mg by mouth 2 (two) times daily.   Yes Historical Provider, MD  senna (SENOKOT) 8.6 MG TABS tablet Take 1 tablet by mouth daily as needed for mild constipation.   Yes Historical Provider, MD  torsemide (DEMADEX) 20 MG tablet Take 20 mg by mouth daily.   Yes Historical  Provider, MD  valsartan (DIOVAN) 320 MG tablet Take 320 mg by mouth daily.   Yes Historical Provider, MD  vitamin C (ASCORBIC ACID) 500 MG tablet Take 1,000 mg by mouth daily.   Yes Historical Provider, MD  zolpidem (AMBIEN) 10 MG tablet Take 10 mg by mouth at bedtime as needed for sleep.   Yes Historical Provider, MD     Review of Systems  Constitutional: Positive for fatigue. Negative for appetite change.  HENT: Positive for rhinorrhea. Negative for congestion and sore throat.   Eyes: Negative.   Respiratory: Positive for cough (dry), chest tightness (only when walking in here) and shortness of breath.   Cardiovascular: Positive for leg swelling. Negative for chest pain and palpitations.  Gastrointestinal: Positive for abdominal distention. Negative for abdominal pain.  Endocrine: Negative.   Genitourinary: Negative.   Musculoskeletal: Negative for back pain and neck pain.  Skin: Negative.   Allergic/Immunologic: Negative.   Neurological: Positive for headaches. Negative for dizziness and light-headedness.  Hematological: Negative for adenopathy. Bruises/bleeds easily.  Psychiatric/Behavioral: Positive for sleep disturbance (sleeping on 2 pillows; wearing CPAP nightly). Negative for dysphoric mood. The patient is not nervous/anxious.        Objective:   Physical Exam  Constitutional: She is oriented to person, place, and time. She appears well-developed and well-nourished.  HENT:  Head: Normocephalic and atraumatic.  Eyes: Conjunctivae are normal. Pupils are equal, round, and reactive to light.  Neck: Normal range of motion. Neck supple.  Cardiovascular: Normal rate and regular rhythm.   Pulmonary/Chest: Effort normal and breath sounds normal. She has no wheezes. She has no rales.  Abdominal: Soft. She exhibits no distension. There is no tenderness.  Musculoskeletal: She exhibits edema (1+ in bilateral lower legs). She exhibits no tenderness.  Neurological: She is alert and  oriented to person, place, and time.  Skin: Skin is warm and dry. No rash noted. No erythema.  Psychiatric: She has a normal mood and affect. Her behavior is normal.  Nursing note and vitals reviewed.   BP 122/70 mmHg  Pulse 75  Resp 20  Ht 5\' 5"  (1.651 m)  Wt 267 lb (121.11 kg)  BMI 44.43 kg/m2  SpO2 96%  LMP  (LMP Unknown)       Assessment & Plan:  1: Chronic heart failure with preserved ejection fraction- Patient presents with shortness of breath and fatigue upon exertion. She says that today is the first day that she's been walking without wearing her oxygen. When she came into the office, she was a little short of breath but says that she quickly recovered after sitting down for a few minutes. Now is just wearing her oxygen at bedtime along with her CPAP. She is already weighing herself daily and says that her weight has been stable. Instructed to call for an overnight weight gain of >2 pounds or a weekly weight gain of >5 pounds. She is  not adding any salt to her food and says that she tries to read food labels. Reviewed how to do that and written information was given to her about that. Encouraged to limit her sodium to between 1500-2000mg  daily. She is doing home PT working on her endurance and strength. 2: HTN- Blood pressure looks good today. Continue medications at this time. 3: Diabetes- She says that her glucose was 120 this morning. Follows closely with PCP in this regard.  Return in 1 month or sooner for any questions/problems before then.

## 2014-09-04 NOTE — Patient Instructions (Addendum)
.Continue weighing daily and call for an overnight weight gain of > 2 pounds or a weekly weight gain of >5 pounds.  Follow a 1500-2000mg  sodium diet.   Low-Sodium Eating Plan Sodium raises blood pressure and causes water to be held in the body. Getting less sodium from food will help lower your blood pressure, reduce any swelling, and protect your heart, liver, and kidneys. We get sodium by adding salt (sodium chloride) to food. Most of our sodium comes from canned, boxed, and frozen foods. Restaurant foods, fast foods, and pizza are also very high in sodium. Even if you take medicine to lower your blood pressure or to reduce fluid in your body, getting less sodium from your food is important. WHAT IS MY PLAN? Most people should limit their sodium intake to 2,300 mg a day. Your health care provider recommends that you limit your sodium intake to _2000mg _ a day.  WHAT DO I NEED TO KNOW ABOUT THIS EATING PLAN? For the low-sodium eating plan, you will follow these general guidelines:  Choose foods with a % Daily Value for sodium of less than 5% (as listed on the food label).   Use salt-free seasonings or herbs instead of table salt or sea salt.   Check with your health care provider or pharmacist before using salt substitutes.   Eat fresh foods.  Eat more vegetables and fruits.  Limit canned vegetables. If you do use them, rinse them well to decrease the sodium.   Limit cheese to 1 oz (28 g) per day.   Eat lower-sodium products, often labeled as "lower sodium" or "no salt added."  Avoid foods that contain monosodium glutamate (MSG). MSG is sometimes added to [place] food and some canned foods.  Check food labels (Nutrition Facts labels) on foods to learn how much sodium is in one serving.  Eat more home-cooked food and less restaurant, buffet, and fast food.  When eating at a restaurant, ask that your food be prepared with less salt or none, if possible.  HOW DO I READ FOOD  LABELS FOR SODIUM INFORMATION? The Nutrition Facts label lists the amount of sodium in one serving of the food. If you eat more than one serving, you must multiply the listed amount of sodium by the number of servings. Food labels may also identify foods as:  Sodium free--Less than 5 mg in a serving.  Very low sodium--35 mg or less in a serving.  Low sodium--140 mg or less in a serving.  Light in sodium--50% less sodium in a serving. For example, if a food that usually has 300 mg of sodium is changed to become light in sodium, it will have 150 mg of sodium.  Reduced sodium--25% less sodium in a serving. For example, if a food that usually has 400 mg of sodium is changed to reduced sodium, it will have 300 mg of sodium. WHAT FOODS CAN I EAT? Grains Low-sodium cereals, including oats, puffed wheat and rice, and shredded wheat cereals. Low-sodium crackers. Unsalted rice and pasta. Lower-sodium bread.  Vegetables Frozen or fresh vegetables. Low-sodium or reduced-sodium canned vegetables. Low-sodium or reduced-sodium tomato sauce and paste. Low-sodium or reduced-sodium tomato and vegetable juices.  Fruits Fresh, frozen, and canned fruit. Fruit juice.  Meat and Other Protein Products Low-sodium canned tuna and salmon. Fresh or frozen meat, poultry, seafood, and fish. Lamb. Unsalted nuts. Dried beans, peas, and lentils without added salt. Unsalted canned beans. Homemade soups without salt. Eggs.  Dairy Milk. Soy milk. Ricotta cheese. Low-sodium  or reduced-sodium cheeses. Yogurt.  Condiments Fresh and dried herbs and spices. Salt-free seasonings. Onion and garlic powders. Low-sodium varieties of mustard and ketchup. Lemon juice.  Fats and Oils Reduced-sodium salad dressings. Unsalted butter.  Other Unsalted popcorn and pretzels.  The items listed above may not be a complete list of recommended foods or beverages. Contact your dietitian for more options. WHAT FOODS ARE NOT  RECOMMENDED? Grains Instant hot cereals. Bread stuffing, pancake, and biscuit mixes. Croutons. Seasoned rice or pasta mixes. Noodle soup cups. Boxed or frozen macaroni and cheese. Self-rising flour. Regular salted crackers. Vegetables Regular canned vegetables. Regular canned tomato sauce and paste. Regular tomato and vegetable juices. Frozen vegetables in sauces. Salted french fries. Olives. Angie Fava. Relishes. Sauerkraut. Salsa. Meat and Other Protein Products Salted, canned, smoked, spiced, or pickled meats, seafood, or fish. Bacon, ham, sausage, hot dogs, corned beef, chipped beef, and packaged luncheon meats. Salt pork. Jerky. Pickled herring. Anchovies, regular canned tuna, and sardines. Salted nuts. Dairy Processed cheese and cheese spreads. Cheese curds. Blue cheese and cottage cheese. Buttermilk.  Condiments Onion and garlic salt, seasoned salt, table salt, and sea salt. Canned and packaged gravies. Worcestershire sauce. Tartar sauce. Barbecue sauce. Teriyaki sauce. Soy sauce, including reduced sodium. Steak sauce. Fish sauce. Oyster sauce. Cocktail sauce. Horseradish. Regular ketchup and mustard. Meat flavorings and tenderizers. Bouillon cubes. Hot sauce. Tabasco sauce. Marinades. Taco seasonings. Relishes. Fats and Oils Regular salad dressings. Salted butter. Margarine. Ghee. Bacon fat.  Other Potato and tortilla chips. Corn chips and puffs. Salted popcorn and pretzels. Canned or dried soups. Pizza. Frozen entrees and pot pies.  The items listed above may not be a complete list of foods and beverages to avoid. Contact your dietitian for more information. Document Released: 07/17/2001 Document Revised: 01/30/2013 Document Reviewed: 11/29/2012 [facility] Patient Information 2015 [place], [place]. This information is not intended to replace advice given to you by your health care provider. Make sure you discuss any questions you have with your health care provider.

## 2014-09-08 NOTE — Progress Notes (Signed)
[facility]  [phone] [phone]  ID: Ozella Rocks OB: 05-Feb-1966  MR#: [id number]  TAV#:[id number]  Patient Care Team: [name], MD as PCP - General (Internal Medicine) Alisa Graff, FNP as Nurse Practitioner (Family Medicine) Rise Mu, PA-C as Physician Assistant (Cardiology)  CHIEF COMPLAINT:  Chief Complaint  Patient presents with  . Hospitalization Follow-up    INTERVAL HISTORY: Patient returns to clinic today for repeat hospital follow-up and reinitiation of Bosulif. She was recently admitted for increasing peripheral edema and shortness of breath likely cardiac related. She continues to have an oxygen requirement, but feels significantly improved. She otherwise feels well and is asymptomatic.  She has no neurologic complaints.  She has no abdominal pain.  She denies any nausea, vomiting, constipation, or diarrhea.  She has had no recent fevers.  She has no chest pain.  She has no urinary complaints.  Patient offers no specific complaints today.  REVIEW OF SYSTEMS:   Review of Systems  Constitutional: Negative.   Respiratory: Positive for shortness of breath.   Cardiovascular: Positive for leg swelling. Negative for chest pain.  Gastrointestinal: Negative.     As per HPI. Otherwise, a complete review of systems is negatve.  PAST MEDICAL HISTORY: Past Medical History  Diagnosis Date  . CML (chronic myelocytic leukemia)     a. on Bosulif (adverse effects include pericardial efusion, pleural effusion, and edema)   . Hypertension   . DM2 (diabetes mellitus, type 2)   . Chronic diastolic CHF (congestive heart failure)     a. echo: 06/11/2014: EF >55%, mild MR, AI, GR1DD; b. echo 08/2014: EF 60-65%, no RWMA, GR1DD, LA nl, RV mod dilated, wall thickness nl, sys fxn mild to mod reduced, PASP severely elevated 65 mm Hg  . OSA (obstructive sleep apnea)     a. on CPAP  . Obesity   . Heart murmur   . Chronic respiratory failure      PAST SURGICAL HISTORY: Past Surgical History  Procedure Laterality Date  . Partial hysterectomy    . Abdominal hysterectomy      FAMILY HISTORY Family History  Problem Relation Age of Onset  . Breast cancer Mother   . Heart disease Father   . Hypertension Father   . Hypertension Sister   . Thyroid disease Sister   . Hypertension Brother   . Diabetes Brother        ADVANCED DIRECTIVES:    HEALTH MAINTENANCE: History  Substance Use Topics  . Smoking status: Never Smoker   . Smokeless tobacco: Never Used  . Alcohol Use: No     Colonoscopy:  PAP:  Bone density:  Lipid panel:  Allergies  Allergen Reactions  . Codeine Hives  . Penicillins Hives    Current Outpatient Prescriptions  Medication Sig Dispense Refill  . docusate sodium (COLACE) 100 MG capsule Take 100 mg by mouth daily as needed for mild constipation.    . ferrous sulfate 325 (65 FE) MG tablet Take 325 mg by mouth daily with breakfast.    . hydrALAZINE (APRESOLINE) 50 MG tablet Take 50 mg by mouth 2 (two) times daily.     Marland Kitchen levocetirizine (XYZAL) 5 MG tablet Take 5 mg by mouth every evening.    . lovastatin (MEVACOR) 40 MG tablet Take 40 mg by mouth at bedtime.    . metFORMIN (GLUCOPHAGE) 500 MG tablet Take 500 mg by mouth daily with breakfast.     . metoprolol (LOPRESSOR) 100 MG tablet  Take 200 mg by mouth 1 day or 1 dose.    . montelukast (SINGULAIR) 10 MG tablet Take 10 mg by mouth at bedtime.    . potassium chloride SA (K-DUR,KLOR-CON) 20 MEQ tablet Take 1 tablet (20 mEq total) by mouth 2 (two) times daily. 30 tablet 3  . ranitidine (ZANTAC) 150 MG tablet Take 150 mg by mouth 2 (two) times daily.    Marland Kitchen senna (SENOKOT) 8.6 MG TABS tablet Take 1 tablet by mouth daily as needed for mild constipation.    . torsemide (DEMADEX) 20 MG tablet Take 20 mg by mouth daily.    . valsartan (DIOVAN) 320 MG tablet Take 320 mg by mouth daily.    . vitamin C (ASCORBIC ACID) 500 MG tablet Take 1,000 mg by mouth  daily.    Marland Kitchen zolpidem (AMBIEN) 10 MG tablet Take 10 mg by mouth at bedtime as needed for sleep.    . NON FORMULARY Oxygen 2 liter.     No current facility-administered medications for this visit.    OBJECTIVE: Filed Vitals:   08/27/14 1113  BP: 150/92  Pulse: 60  Temp: 95.9 F (35.5 C)  Resp: 20     Body mass index is 44.26 kg/(m^2).    ECOG FS:1 - Symptomatic but completely ambulatory  General: Well-developed, well-nourished, no acute distress. Eyes: Pink conjunctiva, anicteric sclera. Lungs: Clear to auscultation bilaterally. Heart: Regular rate and rhythm. No rubs, murmurs, or gallops. Abdomen: Soft, nontender, nondistended. No organomegaly noted, normoactive bowel sounds. Musculoskeletal: 1-2+ bilateral lower extremity edema. Neuro: Alert, answering all questions appropriately. Cranial nerves grossly intact. Skin: No rashes or petechiae noted. Psych: Normal affect.   LAB RESULTS:  Lab Results  Component Value Date   NA 140 08/27/2014   K 4.0 08/27/2014   CL 106 08/27/2014   CO2 24 08/27/2014   GLUCOSE 102* 08/27/2014   BUN 24* 08/27/2014   CREATININE 1.11* 08/27/2014   CALCIUM 9.1 08/27/2014   PROT 6.9 08/27/2014   ALBUMIN 3.6 08/27/2014   AST 18 08/27/2014   ALT 26 08/27/2014   ALKPHOS 74 08/27/2014   BILITOT 0.6 08/27/2014   GFRNONAA 58* 08/27/2014   GFRAA >60 08/27/2014    Lab Results  Component Value Date   WBC 5.2 08/27/2014   NEUTROABS 2.3 08/27/2014   HGB 10.9* 08/27/2014   HCT 34.9* 08/27/2014   MCV 82.3 08/27/2014   PLT 216 08/27/2014     STUDIES: Dg Chest 2 View  08/20/2014   CLINICAL DATA:  CML, receiving oral chemotherapy, shortness of breath  EXAM: CHEST  2 VIEW  COMPARISON:  05/14/2006, 09/25/2012  FINDINGS: Cardiomegaly evident with vascular congestion. Minimal streaky bibasilar atelectasis and a small right effusion evident. No definite focal pneumonia, collapse or consolidation. No pneumothorax. Trachea midline. Obese body habitus.   IMPRESSION: Cardiomegaly with vascular congestion  Bibasilar atelectasis and trace right effusion   Electronically Signed   By: Jerilynn Mages.  Shick M.D.   On: 08/20/2014 21:46   Nm Pulmonary Perf And Vent  08/21/2014   CLINICAL DATA:  Short of breath  EXAM: NUCLEAR MEDICINE VENTILATION - PERFUSION LUNG SCAN  TECHNIQUE: Ventilation images were obtained in multiple projections using inhaled aerosol Tc-32mDTPA. Perfusion images were obtained in multiple projections after intravenous injection of Tc-959mAA.  RADIOPHARMACEUTICALS:  42.8 mCi Technetium-9946mPA aerosol inhalation and 4.31 mCi Technetium-67m65m IV  COMPARISON:  Chest x-ray 08/20/2014  FINDINGS: Ventilation: Elevated right hemidiaphragm is noted on chest x-ray with mild right lower lobe atelectasis  in diffusion. No segmental perfusion defects are identified. Overall perfusion as fever defects and ventilation.  Perfusion: Elevated right hemidiaphragm. Patchy ventilation bilaterally with some DTPA aggregation.  IMPRESSION: Low probability for pulmonary emboli.  Elevated right hemidiaphragm.   Electronically Signed   By: Franchot Gallo M.D.   On: 08/21/2014 15:22    ASSESSMENT: CML  PLAN:    1.  CML: BCR/ABL is undetectable. Continue Bosulif until progression of disease or intolerable side effects. It is unlikely that Bosulif is the underlying etiology for her increasing shortness of breath and peripheral edema.  Previously, patient could not tolerate Gleevec or Sprycel.  Of note, we can always rechallenge with Tasigna in the future.  Patient has not had a bone marrow biopsy since 2006, but has stated on multiple occasions that she would likely refuse this if it is necessary in the future.  Return to clinic in 3 months for laboratory work and further evaluation.   2.  Constipation: Continue stool softener and recommended patient use MiraLAX as needed. 3.  Muscle cramping: Continue Flexeril as needed. 4.  Pancreatitis:  Resolved. Although I do not believe  that Tasigna was the cause, patient is hesitant to restart this medication. 5. Shortness of breath/edema: Treatment per cardiology.   Patient expressed understanding and was in agreement with this plan. She also understands that She can call clinic at any time with any questions, concerns, or complaints.    Lloyd Huger, MD   09/08/2014 8:17 AM

## 2014-09-10 ENCOUNTER — Telehealth: Payer: Self-pay | Admitting: *Deleted

## 2014-09-10 NOTE — Telephone Encounter (Signed)
lmov to ask pt to make 83m fu from 09/03/14

## 2014-10-04 ENCOUNTER — Encounter: Payer: Self-pay | Admitting: Emergency Medicine

## 2014-10-04 ENCOUNTER — Emergency Department: Payer: Medicare Other

## 2014-10-04 ENCOUNTER — Other Ambulatory Visit: Payer: Self-pay

## 2014-10-04 ENCOUNTER — Emergency Department
Admission: EM | Admit: 2014-10-04 | Discharge: 2014-10-04 | Disposition: A | Discharge status: Home / Self Care | Payer: Medicare Other | Source: Home / Self Care | Attending: Emergency Medicine | Admitting: Emergency Medicine

## 2014-10-04 ENCOUNTER — Inpatient Hospital Stay
Admission: EM | Admit: 2014-10-04 | Discharge: 2014-10-10 | DRG: 871 | Disposition: E | Payer: Medicare Other | Attending: Internal Medicine | Admitting: Internal Medicine

## 2014-10-04 DIAGNOSIS — J9601 Acute respiratory failure with hypoxia: Secondary | ICD-10-CM | POA: Diagnosis present

## 2014-10-04 DIAGNOSIS — J9621 Acute and chronic respiratory failure with hypoxia: Secondary | ICD-10-CM | POA: Diagnosis present

## 2014-10-04 DIAGNOSIS — Z803 Family history of malignant neoplasm of breast: Secondary | ICD-10-CM

## 2014-10-04 DIAGNOSIS — A419 Sepsis, unspecified organism: Secondary | ICD-10-CM | POA: Diagnosis present

## 2014-10-04 DIAGNOSIS — Z833 Family history of diabetes mellitus: Secondary | ICD-10-CM | POA: Diagnosis not present

## 2014-10-04 DIAGNOSIS — Z880 Allergy status to penicillin: Secondary | ICD-10-CM

## 2014-10-04 DIAGNOSIS — I959 Hypotension, unspecified: Secondary | ICD-10-CM | POA: Diagnosis present

## 2014-10-04 DIAGNOSIS — Z9981 Dependence on supplemental oxygen: Secondary | ICD-10-CM

## 2014-10-04 DIAGNOSIS — Z6841 Body Mass Index (BMI) 40.0 and over, adult: Secondary | ICD-10-CM

## 2014-10-04 DIAGNOSIS — I517 Cardiomegaly: Secondary | ICD-10-CM | POA: Diagnosis present

## 2014-10-04 DIAGNOSIS — C9210 Chronic myeloid leukemia, BCR/ABL-positive, not having achieved remission: Secondary | ICD-10-CM | POA: Diagnosis present

## 2014-10-04 DIAGNOSIS — J189 Pneumonia, unspecified organism: Secondary | ICD-10-CM | POA: Diagnosis present

## 2014-10-04 DIAGNOSIS — I5032 Chronic diastolic (congestive) heart failure: Secondary | ICD-10-CM | POA: Diagnosis present

## 2014-10-04 DIAGNOSIS — Z885 Allergy status to narcotic agent status: Secondary | ICD-10-CM | POA: Diagnosis not present

## 2014-10-04 DIAGNOSIS — D638 Anemia in other chronic diseases classified elsewhere: Secondary | ICD-10-CM | POA: Diagnosis present

## 2014-10-04 DIAGNOSIS — N179 Acute kidney failure, unspecified: Secondary | ICD-10-CM | POA: Diagnosis present

## 2014-10-04 DIAGNOSIS — G4733 Obstructive sleep apnea (adult) (pediatric): Secondary | ICD-10-CM | POA: Diagnosis present

## 2014-10-04 DIAGNOSIS — Z79899 Other long term (current) drug therapy: Secondary | ICD-10-CM | POA: Diagnosis not present

## 2014-10-04 DIAGNOSIS — Z8249 Family history of ischemic heart disease and other diseases of the circulatory system: Secondary | ICD-10-CM

## 2014-10-04 DIAGNOSIS — I10 Essential (primary) hypertension: Secondary | ICD-10-CM | POA: Diagnosis present

## 2014-10-04 DIAGNOSIS — E119 Type 2 diabetes mellitus without complications: Secondary | ICD-10-CM | POA: Diagnosis present

## 2014-10-04 DIAGNOSIS — R0602 Shortness of breath: Secondary | ICD-10-CM

## 2014-10-04 DIAGNOSIS — A4150 Gram-negative sepsis, unspecified: Secondary | ICD-10-CM | POA: Diagnosis present

## 2014-10-04 LAB — COMPREHENSIVE METABOLIC PANEL
ALBUMIN: 4.3 g/dL (ref 3.5–5.0)
ALBUMIN: 4.2 g/dL (ref 3.5–5.0)
ALK PHOS: 88 U/L (ref 38–126)
ALK PHOS: 88 U/L (ref 38–126)
ALT: 21 U/L (ref 14–54)
ALT: 19 U/L (ref 14–54)
ANION GAP: 16 — AB (ref 5–15)
AST: 34 U/L (ref 15–41)
AST: 53 U/L — AB (ref 15–41)
Anion gap: 10 (ref 5–15)
BILIRUBIN TOTAL: 0.9 mg/dL (ref 0.3–1.2)
BILIRUBIN TOTAL: 1 mg/dL (ref 0.3–1.2)
BUN: 24 mg/dL — AB (ref 6–20)
BUN: 25 mg/dL — AB (ref 6–20)
CALCIUM: 9.8 mg/dL (ref 8.9–10.3)
CALCIUM: 9.7 mg/dL (ref 8.9–10.3)
CO2: 23 mmol/L (ref 22–32)
CO2: 17 mmol/L — AB (ref 22–32)
CREATININE: 1.65 mg/dL — AB (ref 0.44–1.00)
Chloride: 103 mmol/L (ref 101–111)
Chloride: 103 mmol/L (ref 101–111)
Creatinine, Ser: 1.43 mg/dL — ABNORMAL HIGH (ref 0.44–1.00)
GFR calc Af Amer: 50 mL/min — ABNORMAL LOW (ref >60)
GFR calc Af Amer: 42 mL/min — ABNORMAL LOW (ref >60)
GFR calc non Af Amer: 43 mL/min — ABNORMAL LOW (ref >60)
GFR calc non Af Amer: 36 mL/min — ABNORMAL LOW (ref >60)
GLUCOSE: 117 mg/dL — AB (ref 65–99)
GLUCOSE: 108 mg/dL — AB (ref 65–99)
Potassium: 3.9 mmol/L (ref 3.5–5.1)
Potassium: 4.1 mmol/L (ref 3.5–5.1)
SODIUM: 136 mmol/L (ref 135–145)
SODIUM: 136 mmol/L (ref 135–145)
TOTAL PROTEIN: 8.1 g/dL (ref 6.5–8.1)
TOTAL PROTEIN: 8.3 g/dL — AB (ref 6.5–8.1)

## 2014-10-04 LAB — CBC WITH DIFFERENTIAL/PLATELET
Basophils Absolute: 0 K/uL (ref 0–0.1)
Basophils Absolute: 0 10*3/uL (ref 0–0.1)
Basophils Relative: 0 %
Basophils Relative: 0 %
EOS ABS: 0 10*3/uL (ref 0–0.7)
EOS PCT: 0 %
Eosinophils Absolute: 0 K/uL (ref 0–0.7)
Eosinophils Relative: 0 %
HCT: 39.6 % (ref 35.0–47.0)
HCT: 42 % (ref 35.0–47.0)
Hemoglobin: 12.9 g/dL (ref 12.0–16.0)
Hemoglobin: 13.4 g/dL (ref 12.0–16.0)
LYMPHS ABS: 0.8 10*3/uL — AB (ref 1.0–3.6)
LYMPHS PCT: 24 %
Lymphocytes Relative: 4 %
Lymphs Abs: 0.4 K/uL — ABNORMAL LOW (ref 1.0–3.6)
MCH: 25.9 pg — ABNORMAL LOW (ref 26.0–34.0)
MCH: 25.6 pg — AB (ref 26.0–34.0)
MCHC: 32.5 g/dL (ref 32.0–36.0)
MCHC: 32 g/dL (ref 32.0–36.0)
MCV: 79.5 fL — ABNORMAL LOW (ref 80.0–100.0)
MCV: 79.9 fL — AB (ref 80.0–100.0)
MONO ABS: 0.1 10*3/uL — AB (ref 0.2–0.9)
MONOS PCT: 2 %
Monocytes Absolute: 1.2 K/uL — ABNORMAL HIGH (ref 0.2–0.9)
Monocytes Relative: 10 %
Neutro Abs: 11 K/uL — ABNORMAL HIGH (ref 1.4–6.5)
Neutro Abs: 2.4 10*3/uL (ref 1.4–6.5)
Neutrophils Relative %: 86 %
Neutrophils Relative %: 74 %
PLATELETS: 137 10*3/uL — AB (ref 150–440)
Platelets: 153 K/uL (ref 150–440)
RBC: 4.98 MIL/uL (ref 3.80–5.20)
RBC: 5.25 MIL/uL — AB (ref 3.80–5.20)
RDW: 15.3 % — ABNORMAL HIGH (ref 11.5–14.5)
RDW: 15.6 % — ABNORMAL HIGH (ref 11.5–14.5)
WBC: 12.8 K/uL — ABNORMAL HIGH (ref 3.6–11.0)
WBC: 3.2 10*3/uL — AB (ref 3.6–11.0)

## 2014-10-04 LAB — LIPASE, BLOOD
Lipase: 19 U/L — ABNORMAL LOW (ref 22–51)
Lipase: 15 U/L — ABNORMAL LOW (ref 22–51)

## 2014-10-04 LAB — TROPONIN I
Troponin I: <0.03 ng/mL (ref <0.031)
Troponin I: <0.03 ng/mL (ref <0.031)

## 2014-10-04 LAB — PROTIME-INR
INR: 1.12
Prothrombin Time: 14.6 seconds (ref 11.4–15.0)

## 2014-10-04 LAB — GLUCOSE, CAPILLARY: GLUCOSE-CAPILLARY: 85 mg/dL (ref 65–99)

## 2014-10-04 LAB — APTT: APTT: 32 s (ref 24–36)

## 2014-10-04 LAB — LACTIC ACID, PLASMA
Lactic Acid, Venous: 6 mmol/L (ref 0.5–2.0)
Lactic Acid, Venous: 4.9 mmol/L (ref 0.5–2.0)

## 2014-10-04 LAB — MRSA PCR SCREENING: MRSA BY PCR: NEGATIVE

## 2014-10-04 MED ORDER — POTASSIUM CHLORIDE CRYS ER 20 MEQ PO TBCR
20.0000 meq | EXTENDED_RELEASE_TABLET | Freq: Two times a day (BID) | ORAL | Status: DC
  Administered 2014-10-04 – 2014-10-05 (×2): 20 meq via ORAL
  Filled 2014-10-04 (×2): qty 1

## 2014-10-04 MED ORDER — MONTELUKAST SODIUM 10 MG PO TABS
10.0000 mg | ORAL_TABLET | Freq: Every day | ORAL | Status: DC
  Administered 2014-10-04 – 2014-10-05 (×2): 10 mg via ORAL
  Filled 2014-10-04 (×2): qty 1

## 2014-10-04 MED ORDER — BOSUTINIB 500 MG PO TABS: 500.0000 mg | ORAL_TABLET | Freq: Every day | ORAL | Status: DC

## 2014-10-04 MED ORDER — SODIUM CHLORIDE 0.9 % IV SOLN: Freq: Once | INTRAVENOUS | Status: DC

## 2014-10-04 MED ORDER — SODIUM CHLORIDE 0.9 % IV BOLUS (SEPSIS)
1000.0000 mL | INTRAVENOUS | Status: AC
  Administered 2014-10-04 (×3): 1000 mL via INTRAVENOUS

## 2014-10-04 MED ORDER — CYANOCOBALAMIN 500 MCG PO TABS
500.0000 ug | ORAL_TABLET | Freq: Every day | ORAL | Status: DC
  Administered 2014-10-05: 500 ug via ORAL
  Filled 2014-10-04 (×2): qty 1

## 2014-10-04 MED ORDER — SENNA 8.6 MG PO TABS: 1.0000 | ORAL_TABLET | Freq: Every day | ORAL | Status: DC | PRN

## 2014-10-04 MED ORDER — TORSEMIDE 20 MG PO TABS
20.0000 mg | ORAL_TABLET | Freq: Every day | ORAL | Status: DC
  Administered 2014-10-05: 20 mg via ORAL
  Filled 2014-10-04: qty 1

## 2014-10-04 MED ORDER — FUROSEMIDE 10 MG/ML IJ SOLN
40.0000 mg | Freq: Once | INTRAMUSCULAR | Status: AC
  Administered 2014-10-04: 40 mg via INTRAVENOUS
  Filled 2014-10-04: qty 4

## 2014-10-04 MED ORDER — LEVOCETIRIZINE DIHYDROCHLORIDE 5 MG PO TABS: 5.0000 mg | ORAL_TABLET | Freq: Every evening | ORAL | Status: DC

## 2014-10-04 MED ORDER — ACETAMINOPHEN 500 MG PO TABS
1000.0000 mg | ORAL_TABLET | Freq: Once | ORAL | Status: AC
  Administered 2014-10-04: 1000 mg via ORAL
  Filled 2014-10-04: qty 2

## 2014-10-04 MED ORDER — TRAMADOL HCL 50 MG PO TABS
50.0000 mg | ORAL_TABLET | Freq: Four times a day (QID) | ORAL | Status: DC | PRN
  Administered 2014-10-05 (×2): 50 mg via ORAL
  Filled 2014-10-04 (×2): qty 1

## 2014-10-04 MED ORDER — ACETAMINOPHEN 500 MG PO TABS
1000.0000 mg | ORAL_TABLET | Freq: Once | ORAL | Status: AC
  Administered 2014-10-04: 1000 mg via ORAL

## 2014-10-04 MED ORDER — DOCUSATE SODIUM 100 MG PO CAPS: 100.0000 mg | ORAL_CAPSULE | Freq: Every day | ORAL | Status: DC | PRN

## 2014-10-04 MED ORDER — ACETAMINOPHEN 325 MG PO TABS
650.0000 mg | ORAL_TABLET | ORAL | Status: DC | PRN
  Administered 2014-10-05: 650 mg via ORAL
  Filled 2014-10-04: qty 2

## 2014-10-04 MED ORDER — ACETAMINOPHEN 500 MG PO TABS
ORAL_TABLET | ORAL | Status: AC
  Administered 2014-10-04: 1000 mg via ORAL
  Filled 2014-10-04: qty 2

## 2014-10-04 MED ORDER — LEVOFLOXACIN IN D5W 750 MG/150ML IV SOLN
750.0000 mg | INTRAVENOUS | Status: DC
  Administered 2014-10-05: 750 mg via INTRAVENOUS
  Filled 2014-10-04 (×2): qty 150

## 2014-10-04 MED ORDER — VITAMIN C 500 MG PO TABS
1000.0000 mg | ORAL_TABLET | Freq: Every day | ORAL | Status: DC
  Administered 2014-10-05: 1000 mg via ORAL
  Filled 2014-10-04: qty 2

## 2014-10-04 MED ORDER — VANCOMYCIN HCL IN DEXTROSE 1-5 GM/200ML-% IV SOLN
INTRAVENOUS | Status: AC
  Filled 2014-10-04: qty 200

## 2014-10-04 MED ORDER — DEXTROSE 5 % IV SOLN
2.0000 g | Freq: Once | INTRAVENOUS | Status: AC
  Administered 2014-10-04: 2 g via INTRAVENOUS
  Filled 2014-10-04: qty 2

## 2014-10-04 MED ORDER — LORATADINE 10 MG PO TABS
10.0000 mg | ORAL_TABLET | Freq: Every day | ORAL | Status: DC
  Administered 2014-10-04 – 2014-10-05 (×2): 10 mg via ORAL
  Filled 2014-10-04 (×2): qty 1

## 2014-10-04 MED ORDER — PRAVASTATIN SODIUM 20 MG PO TABS
10.0000 mg | ORAL_TABLET | Freq: Every day | ORAL | Status: DC
  Administered 2014-10-05: 10 mg via ORAL
  Filled 2014-10-04 (×2): qty 1

## 2014-10-04 MED ORDER — VANCOMYCIN HCL IN DEXTROSE 1-5 GM/200ML-% IV SOLN
1000.0000 mg | Freq: Once | INTRAVENOUS | Status: AC
  Administered 2014-10-04: 1000 mg via INTRAVENOUS
  Filled 2014-10-04: qty 200

## 2014-10-04 MED ORDER — HYDRALAZINE HCL 20 MG/ML IJ SOLN
10.0000 mg | Freq: Four times a day (QID) | INTRAMUSCULAR | Status: DC | PRN
  Filled 2014-10-04: qty 1

## 2014-10-04 MED ORDER — VANCOMYCIN HCL 10 G IV SOLR
1250.0000 mg | INTRAVENOUS | Status: DC
  Administered 2014-10-04: 1250 mg via INTRAVENOUS
  Filled 2014-10-04 (×3): qty 1250

## 2014-10-04 MED ORDER — HYDROCOD POLST-CPM POLST ER 10-8 MG/5ML PO SUER: 5.0000 mL | Freq: Two times a day (BID) | ORAL | Status: AC

## 2014-10-04 MED ORDER — ZOLPIDEM TARTRATE 5 MG PO TABS
10.0000 mg | ORAL_TABLET | Freq: Every evening | ORAL | Status: DC | PRN
  Administered 2014-10-05 (×2): 10 mg via ORAL
  Filled 2014-10-04 (×2): qty 2

## 2014-10-04 MED ORDER — INSULIN ASPART 100 UNIT/ML ~~LOC~~ SOLN
0.0000 [IU] | Freq: Every day | SUBCUTANEOUS | Status: DC
  Filled 2014-10-04: qty 1

## 2014-10-04 MED ORDER — LORAZEPAM 2 MG/ML IJ SOLN: 1.0000 mg | Freq: Once | INTRAMUSCULAR | Status: DC

## 2014-10-04 MED ORDER — SODIUM CHLORIDE 0.9 % IV BOLUS (SEPSIS)
500.0000 mL | INTRAVENOUS | Status: AC
  Administered 2014-10-04: 500 mL via INTRAVENOUS

## 2014-10-04 MED ORDER — INSULIN ASPART 100 UNIT/ML ~~LOC~~ SOLN
0.0000 [IU] | Freq: Three times a day (TID) | SUBCUTANEOUS | Status: DC
  Administered 2014-10-05: 2 [IU] via SUBCUTANEOUS
  Administered 2014-10-05: 1 [IU] via SUBCUTANEOUS
  Filled 2014-10-04: qty 1
  Filled 2014-10-04: qty 2

## 2014-10-04 MED ORDER — FERROUS SULFATE 325 (65 FE) MG PO TABS
325.0000 mg | ORAL_TABLET | Freq: Every day | ORAL | Status: DC
  Administered 2014-10-05: 325 mg via ORAL
  Filled 2014-10-04: qty 1

## 2014-10-04 MED ORDER — SODIUM CHLORIDE 0.9 % IV SOLN
Freq: Once | INTRAVENOUS | Status: AC
  Administered 2014-10-04: 12:00:00 via INTRAVENOUS

## 2014-10-04 MED ORDER — LEVOFLOXACIN IN D5W 750 MG/150ML IV SOLN
750.0000 mg | Freq: Once | INTRAVENOUS | Status: AC
  Administered 2014-10-04: 750 mg via INTRAVENOUS
  Filled 2014-10-04: qty 150

## 2014-10-04 MED ORDER — LEVOFLOXACIN 750 MG PO TABS: 750.0000 mg | ORAL_TABLET | Freq: Every day | ORAL | Status: AC

## 2014-10-04 MED ORDER — ENOXAPARIN SODIUM 40 MG/0.4ML ~~LOC~~ SOLN
40.0000 mg | SUBCUTANEOUS | Status: DC
  Administered 2014-10-04 – 2014-10-05 (×2): 40 mg via SUBCUTANEOUS
  Filled 2014-10-04 (×2): qty 0.4

## 2014-10-04 NOTE — ED Notes (Signed)
Pt to ed with c/o vomiting and dizziness this am.  Reports 4 episodes of vomiting this am.  Reports mild chest pain. Reports abd pain. Denies diarrhea

## 2014-10-04 NOTE — ED Notes (Signed)
Patient to ED with c/o fever and shortness of breath since leaving the hospital this morning. Patient brought straight back to room for triage due to sats of 78 on room air and temp of 103.1

## 2014-10-04 NOTE — H&P (Signed)
[facility] NAME: Cassidy Gates    MR#:  [id number]  DATE OF BIRTH:  1965/05/24  DATE OF ADMISSION:  09/14/2014  PRIMARY CARE PHYSICIAN: [name], MD   REQUESTING/REFERRING PHYSICIAN: Dr. Joni Fears  CHIEF COMPLAINT:  Pneumonia HISTORY OF PRESENT ILLNESS:  Cassidy Gates  is a 49 y.o. female with a known history of CML currently on chemotherapy, essential hypertension, type 2 diabetes, chronic diastolic heart failure and OSA on CPAP who presents with above complaint. Patient was seen earlier this morning in the emergency room and diagnosed with a pneumonia and subsequently was discharged home with oxygen which she wears when necessary. However she returns after leaving the emergency room with acute respiratory hypoxic failure. In the emergency room she received aztreonam and vancomycin and this morning she received Levaquin. Patient is noted have hypoxia she is 90% on 6 L nasal cannula as well as tachycardia and fever.  PAST MEDICAL HISTORY:   Past Medical History  Diagnosis Date  . CML (chronic myelocytic leukemia)     a. on Bosulif (adverse effects include pericardial efusion, pleural effusion, and edema)   . Hypertension   . DM2 (diabetes mellitus, type 2)   . Chronic diastolic CHF (congestive heart failure)     a. echo: 06/11/2014: EF >55%, mild MR, AI, GR1DD; b. echo 08/2014: EF 60-65%, no RWMA, GR1DD, LA nl, RV mod dilated, wall thickness nl, sys fxn mild to mod reduced, PASP severely elevated 65 mm Hg  . OSA (obstructive sleep apnea)     a. on CPAP  . Obesity   . Heart murmur   . Chronic respiratory failure     PAST SURGICAL HISTORY:   Past Surgical History  Procedure Laterality Date  . Partial hysterectomy    . Abdominal hysterectomy      SOCIAL HISTORY:   Social History  Substance Use Topics  . Smoking status: Never Smoker   . Smokeless tobacco: Never Used  . Alcohol Use: No    FAMILY HISTORY:    Family History  Problem Relation Age of Onset  . Breast cancer Mother   . Heart disease Father   . Hypertension Father   . Hypertension Sister   . Thyroid disease Sister   . Hypertension Brother   . Diabetes Brother     DRUG ALLERGIES:   Allergies  Allergen Reactions  . Codeine Hives  . Penicillins Hives     REVIEW OF SYSTEMS:  CONSTITUTIONAL: +++ fever, fatigue or weakness.  EYES: No blurred or double vision.  EARS, NOSE, AND THROAT: No tinnitus or ear pain.  RESPIRATORY: N++cough, shortness of breath, NO wheezing or hemoptysis.  CARDIOVASCULAR: No chest pain, orthopnea, edema.  GASTROINTESTINAL: No nausea, vomiting, diarrhea or abdominal pain.  GENITOURINARY: No dysuria, hematuria.  ENDOCRINE: No polyuria, nocturia,  HEMATOLOGY: ++CML and  anemia, NO easy bruising or bleeding SKIN: No rash or lesion. MUSCULOSKELETAL: No joint pain or arthritis.   NEUROLOGIC: No tingling, numbness, weakness.  PSYCHIATRY: No anxiety or depression.   MEDICATIONS AT HOME:   Prior to Admission medications   Medication Sig Start Date End Date Taking? Authorizing Provider  bosutinib (BOSULIF) 500 MG tablet Take 500 mg by mouth daily with breakfast.    Yes Historical Provider, MD  docusate sodium (COLACE) 100 MG capsule Take 100 mg by mouth daily as needed for mild constipation.   Yes Historical Provider, MD  ferrous sulfate 325 (65 FE) MG tablet Take 325 mg by  mouth daily with breakfast.   Yes Historical Provider, MD  hydrALAZINE (APRESOLINE) 50 MG tablet Take 50 mg by mouth 2 (two) times daily.   Yes Historical Provider, MD  levocetirizine (XYZAL) 5 MG tablet Take 5 mg by mouth every evening.   Yes Historical Provider, MD  lovastatin (MEVACOR) 40 MG tablet Take 40 mg by mouth at bedtime.   Yes Historical Provider, MD  metFORMIN (GLUCOPHAGE) 500 MG tablet Take 500 mg by mouth every evening.    Yes Historical Provider, MD  metoprolol (TOPROL-XL) 200 MG 24 hr tablet Take 200 mg by mouth  daily.   Yes Historical Provider, MD  montelukast (SINGULAIR) 10 MG tablet Take 10 mg by mouth at bedtime.   Yes Historical Provider, MD  potassium chloride SA (K-DUR,KLOR-CON) 20 MEQ tablet Take 1 tablet (20 mEq total) by mouth 2 (two) times daily. 08/23/14  Yes Vishwanath Hande, MD  ranitidine (ZANTAC) 150 MG tablet Take 150 mg by mouth 2 (two) times daily.   Yes Historical Provider, MD  senna (SENOKOT) 8.6 MG TABS tablet Take 1 tablet by mouth daily as needed for mild constipation.   Yes Historical Provider, MD  torsemide (DEMADEX) 20 MG tablet Take 20 mg by mouth daily.   Yes Historical Provider, MD  valsartan (DIOVAN) 320 MG tablet Take 320 mg by mouth daily.   Yes Historical Provider, MD  vitamin B-12 (CYANOCOBALAMIN) 500 MCG tablet Take 500 mcg by mouth daily.   Yes Historical Provider, MD  vitamin C (ASCORBIC ACID) 500 MG tablet Take 1,000 mg by mouth daily.   Yes Historical Provider, MD  zolpidem (AMBIEN) 10 MG tablet Take 10 mg by mouth at bedtime as needed for sleep.   Yes Historical Provider, MD  chlorpheniramine-HYDROcodone (TUSSIONEX PENNKINETIC ER) 10-8 MG/5ML SUER Take 5 mLs by mouth 2 (two) times daily. 10/03/2014   Earleen Newport, MD  levofloxacin (LEVAQUIN) 750 MG tablet Take 1 tablet (750 mg total) by mouth daily. 09/10/2014   Earleen Newport, MD      VITAL SIGNS:  Blood pressure 102/67, pulse 108, temperature 103.1 F (39.5 C), temperature source Oral, resp. rate 53, height 5\' 5"  (1.651 m), weight 113.399 kg (250 lb), SpO2 88 %.  PHYSICAL EXAMINATION:  GENERAL:  49 y.o.-year-old patient lying in the bed with mild acute distress.  EYES: Pupils equal, round, reactive to light and accommodation. No scleral icterus. Extraocular muscles intact.  HEENT: Head atraumatic, normocephalic. Oropharynx and nasopharynx clear.  NECK:  Supple, no jugular venous distention. No thyroid enlargement, no tenderness.  LUNGS: Normal breath sounds bilaterally, no wheezing, rales,rhonchi or  crepitation. No use of accessory muscles of respiration. + DYSPNEA  CARDIOVASCULAR: tachycardic  No murmurs, rubs, or gallops.  ABDOMEN: Soft, nontender, nondistended. Bowel sounds present. No organomegaly or mass.  EXTREMITIES: No pedal edema, cyanosis, or clubbing.  NEUROLOGIC: Cranial nerves II through XII are grossly intact. No focal deficits. PSYCHIATRIC: The patient is alert and oriented x 3. FLAT AFFECT SKIN: No obvious rash, lesion, or ulcer.   LABORATORY PANEL:   CBC  Recent Labs Lab 09/15/2014 1609  WBC 3.2*  HGB 13.4  HCT 42.0  PLT 137*   ------------------------------------------------------------------------------------------------------------------  Chemistries   Recent Labs Lab 09/11/2014 1609  NA 136  K 4.1  CL 103  CO2 17*  GLUCOSE 108*  BUN 25*  CREATININE 1.65*  CALCIUM 9.7  AST 53*  ALT 19  ALKPHOS 88  BILITOT 1.0   ------------------------------------------------------------------------------------------------------------------  Cardiac Enzymes  Recent Labs Lab  10/05/2014 1136 09/25/2014 1609  TROPONINI <0.03 <0.03   ------------------------------------------------------------------------------------------------------------------  RADIOLOGY:  Dg Chest 2 View  09/30/2014   CLINICAL DATA:  Vomiting, fever and shortness of breath.  EXAM: CHEST  2 VIEW  COMPARISON:  08/20/2014  FINDINGS: There is mild cardiac enlargement. Mild asymmetric elevation of the right hemidiaphragm is noted. Airspace opacity is identified within both lung bases. Upper lobes are clear.  IMPRESSION: 1. Bibasilar opacities noted suspicious for pneumonia.   Electronically Signed   By: Kerby Moors M.D.   On: 09/20/2014 12:07   Dg Chest Port 1 View  10/01/2014   IMPRESSION: Hypoinspiratory exam with low lung volumes limiting characterization of the perihilar regions and lung bases.  Given the cardiomegaly, I cannot exclude some degree of central pulmonary vascular congestion  related to mild volume overload/CHF.  Similarly, the subtle bibasilar opacities described on the previous report are likely related to the low lung volumes but associated edema and/or early developing pneumonias cannot be confidently excluded.  Would consider short-term follow-up chest x-ray with better inspiratory effort.   Electronically Signed   By: Franki Cabot M.D.   On: 09/13/2014 16:28    EKG:  Sinus tachycardia heart rate 118 no ST elevation depression  IMPRESSION AND PLAN:  This is a 49 year old female with a history of CML, diabetes, hypertension and does occur failure who presents after being seen in the emergency room and sent home with acute hypoxic respiratory failure.  1. Acute hypoxic respiratory failure: Patient does wear oxygen when necessary at home. She presents with hypoxia secondary to pneumonia. She presents with a fever and cough which is consistent with pneumonia. I will treat her with Levaquin and vancomycin as she is allergic to penicillin. Blood cultures were ordered in the emergency room. Continue oxygen and titrate as needed.  2. Sepsis: This is secondary to community acquired pneumonia. She will be placed on vancomycin and Levaquin. CBC should be followed as it is much lower then it was this am, and if she becomes neutrapenic she will need broader spectrum antibiotics.  3. community acquired pneumonia: As mentioned patient is on vancomycin and Levaquin. Please follow-up on final blood cultures.  4. CML: Patient is currently followed by oncology and receiving chemotherapy. I will ask evaluation by oncology for further evaluation and management. We may need to change Levaquin to aztreonam.  5. Diabetes type 2 without complication: Patient will be an slight scale insulin and ADA diet. Hold metformin.  6. Essential hypertension: Due to sepsis and acute respiratory failure with hypotension I will hold her hypertensive medications and follow patient's blood  pressure.  7. Anemia of chronic disease: Hemoglobin is at baseline. Continue ferrous sulfate.  8. OSA: CPAP will be ordered for the night.  9. Diastolic heart failure: Patient does not appear to be and heart failure this time. She had a recent echocardiogram which showed a normal ejection fraction. Chest x-ray shows possible edema however her symptoms are more consistent with pneumonia. Patient intake and output will be monitored.   All the records are reviewed and case discussed with ED provider. Management plans discussed with the patient and she is in agreement.  CODE STATUS: DNR  CRITICAL CARE TOTAL TIME TAKING CARE OF THIS PATIENT: 55 minutes.    Manvi Guilliams M.D on 10/01/2014 at 5:34 PM  Between 7am to 6pm - Pager - [phone] After 6pm go to [email or web address] - password [email or web address] [facility]  [facility]  Office  [phone]  CC: Primary care physician;  [name], MD

## 2014-10-04 NOTE — Progress Notes (Signed)
ANTIBIOTIC CONSULT NOTE - INITIAL  Pharmacy Consult for Vancomycin/Levaquin Indication: pneumonia  Allergies  Allergen Reactions  . Codeine Hives  . Penicillins Hives    Patient Measurements: Height: 5\' 5"  (165.1 cm) Weight: 250 lb (113.399 kg) IBW/kg (Calculated) : 57 Adjusted Body Weight: 79.6 kg  Vital Signs: Temp: 98.5 F (36.9 C) (08/26 1956) Temp Source: Oral (08/26 1956) BP: 61/24 mmHg (08/26 2000) Pulse Rate: 109 (08/26 2000) Intake/Output from previous day:   Intake/Output from this shift:    Labs:  Recent Labs  09/14/2014 1136 09/15/2014 1609  WBC 12.8* 3.2*  HGB 12.9 13.4  PLT 153 137*  CREATININE 1.43* 1.65*   Estimated Creatinine Clearance: 53 mL/min (by C-G formula based on Cr of 1.65). No results for input(s): VANCOTROUGH, VANCOPEAK, VANCORANDOM, GENTTROUGH, GENTPEAK, GENTRANDOM, TOBRATROUGH, TOBRAPEAK, TOBRARND, AMIKACINPEAK, AMIKACINTROU, AMIKACIN in the last 72 hours.   Microbiology: No results found for this or any previous visit (from the past 720 hour(s)).  Medical History: Past Medical History  Diagnosis Date  . CML (chronic myelocytic leukemia)     a. on Bosulif (adverse effects include pericardial efusion, pleural effusion, and edema)   . Hypertension   . DM2 (diabetes mellitus, type 2)   . Chronic diastolic CHF (congestive heart failure)     a. echo: 06/11/2014: EF >55%, mild MR, AI, GR1DD; b. echo 08/2014: EF 60-65%, no RWMA, GR1DD, LA nl, RV mod dilated, wall thickness nl, sys fxn mild to mod reduced, PASP severely elevated 65 mm Hg  . OSA (obstructive sleep apnea)     a. on CPAP  . Obesity   . Heart murmur   . Chronic respiratory failure     Medications:  Anti-infectives    Start     Dose/Rate Route Frequency Ordered Stop   10/05/14 1000  levofloxacin (LEVAQUIN) IVPB 750 mg     750 mg 100 mL/hr over 90 Minutes Intravenous Every 24 hours 09/28/2014 2117     10/05/14 0000  vancomycin (VANCOCIN) 1,250 mg in sodium chloride 0.9 % 250  mL IVPB     1,250 mg 166.7 mL/hr over 90 Minutes Intravenous Every 18 hours 09/11/2014 2117     09/22/2014 1615  aztreonam (AZACTAM) 2 g in dextrose 5 % 50 mL IVPB     2 g 100 mL/hr over 30 Minutes Intravenous  Once 09/27/2014 1605 10/09/2014 1821   10/02/2014 1615  vancomycin (VANCOCIN) IVPB 1000 mg/200 mL premix     1,000 mg 200 mL/hr over 60 Minutes Intravenous  Once 10/05/2014 1605 10/01/2014 1739     Assessment: Patient admitted for possible pneumonia. Empirically started on Levaquin and Vancomycin.  Goal of Therapy:  Vancomycin trough level 15-20 mcg/ml  Plan:  Measure antibiotic drug levels at steady state Follow up culture results  Will order Levaquin 750mg  IV q24h. Will order Vancomycin 1250mg  IV q18h to start 8hr after initial 1g dose for stacked dosing. Will check a trough level prior to 4th dose.  Paulina Fusi, PharmD, BCPS 09/30/2014 9:35 PM

## 2014-10-04 NOTE — ED Provider Notes (Addendum)
[facility] Emergency Department Provider Note     Time seen: ----------------------------------------- 11:24 AM on 09/27/2014 -----------------------------------------    I have reviewed the triage vital signs and the nursing notes.   HISTORY  Chief Complaint Near Syncope    HPI Cassidy Gates is a 49 y.o. female who presents ER for vomiting and dizziness this morning. Patient reports 4 episodes of vomiting this morning, reports mid chest pain that is mild. Report some abdominal pain, denies any diarrhea. She presents febrile here with a temperature of 102.7. Patient recently admitted the hospital for new onset congestive heart failure. Patient states when she left the hospital 3 weeks ago she was feeling better.   Past Medical History  Diagnosis Date  . CML (chronic myelocytic leukemia)     a. on Bosulif (adverse effects include pericardial efusion, pleural effusion, and edema)   . Hypertension   . DM2 (diabetes mellitus, type 2)   . Chronic diastolic CHF (congestive heart failure)     a. echo: 06/11/2014: EF >55%, mild MR, AI, GR1DD; b. echo 08/2014: EF 60-65%, no RWMA, GR1DD, LA nl, RV mod dilated, wall thickness nl, sys fxn mild to mod reduced, PASP severely elevated 65 mm Hg  . OSA (obstructive sleep apnea)     a. on CPAP  . Obesity   . Heart murmur   . Chronic respiratory failure     Patient Active Problem List   Diagnosis Date Noted  . Chronic respiratory failure   . Chronic diastolic CHF (congestive heart failure)   . HTN (hypertension) 08/21/2014  . DM2 (diabetes mellitus, type 2) 08/21/2014  . AKI (acute kidney injury) 08/21/2014  . CML (chronic myelocytic leukemia) 08/21/2014  . OSA (obstructive sleep apnea) 08/21/2014  . SOB (shortness of breath)   . Morbid obesity     Past Surgical History  Procedure Laterality Date  . Partial hysterectomy    . Abdominal hysterectomy      Allergies Codeine and Penicillins  Social  History Social History  Substance Use Topics  . Smoking status: Never Smoker   . Smokeless tobacco: Never Used  . Alcohol Use: No    Review of Systems Constitutional: Negative for fever. Eyes: Negative for visual changes. ENT: Negative for sore throat. Cardiovascular: Negative for chest pain. Respiratory: Positive for shortness of breath Gastrointestinal: Positive for abdominal pain, vomiting Genitourinary: Negative for dysuria. Musculoskeletal: Negative for back pain. Skin: Negative for rash. Neurological: Negative for headaches, positive for dizziness  10-point ROS otherwise negative.  ____________________________________________   PHYSICAL EXAM:  VITAL SIGNS: ED Triage Vitals  Enc Vitals Group     BP 10/05/2014 1056 182/79 mmHg     Pulse Rate 09/27/2014 1056 103     Resp 09/20/2014 1056 20     Temp 09/13/2014 1056 102.7 F (39.3 C)     Temp Source 09/12/2014 1056 Oral     SpO2 --      Weight 09/30/2014 1056 250 lb (113.399 kg)     Height 09/30/2014 1056 5\' 5"  (1.651 m)     Head Cir --      Peak Flow --      Pain Score 09/22/2014 1057 10     Pain Loc --      Pain Edu? --      Excl. in [place]? --     Constitutional: Alert and oriented. Mild distress Eyes: Conjunctivae are normal. PERRL. Normal extraocular movements. ENT   Head: Normocephalic and atraumatic.   Nose: No  congestion/rhinnorhea.   Mouth/Throat: Mucous membranes are moist.   Neck: No stridor. Cardiovascular: Rapid rate, regular rhythm Normal and symmetric distal pulses are present in all extremities. No murmurs, rubs, or gallops. Respiratory: Normal respiratory effort without tachypnea nor retractions. Breath sounds are clear and equal bilaterally. No wheezes/rales/rhonchi. Gastrointestinal: Soft and nontender. No distention. No abdominal bruits.  Musculoskeletal: Nontender with normal range of motion in all extremities. No joint effusions.  No lower extremity tenderness, peripheral edema is  present Neurologic:  Normal speech and language. No gross focal neurologic deficits are appreciated. Speech is normal. No gait instability. Skin:  Skin is warm, dry and intact. No rash noted. Psychiatric: Mood and affect are normal. Speech and behavior are normal. Patient exhibits appropriate insight and judgment. ____________________________________________  EKG: Interpreted by me. Sinus tachycardia with a rate 104 bpm, normal PR interval, normal QRS with, normal QT interval.  ____________________________________________  ED COURSE:  Pertinent labs & imaging results that were available during my care of the patient were reviewed by me and considered in my medical decision making (see chart for details). Patient appears ill, unclear etiology. She will need labs, cultures, x-ray ____________________________________________    LABS (pertinent positives/negatives)  Labs Reviewed  CBC WITH DIFFERENTIAL/PLATELET - Abnormal; Notable for the following:    WBC 12.8 (*)    MCV 79.5 (*)    MCH 25.9 (*)    RDW 15.3 (*)    Neutro Abs 11.0 (*)    Lymphs Abs 0.4 (*)    Monocytes Absolute 1.2 (*)    All other components within normal limits  COMPREHENSIVE METABOLIC PANEL - Abnormal; Notable for the following:    Glucose, Bld 117 (*)    BUN 24 (*)    Creatinine, Ser 1.43 (*)    GFR calc non Af Amer 43 (*)    GFR calc Af Amer 50 (*)    All other components within normal limits  LIPASE, BLOOD - Abnormal; Notable for the following:    Lipase 19 (*)    All other components within normal limits  CULTURE, BLOOD (ROUTINE X 2)  CULTURE, BLOOD (ROUTINE X 2)  URINE CULTURE  TROPONIN I  URINALYSIS COMPLETEWITH MICROSCOPIC ([facility] ONLY)    RADIOLOGY Images were viewed by me  Chest x-ray IMPRESSION: 1. Bibasilar opacities noted suspicious for pneumonia. ____________________________________________  FINAL ASSESSMENT AND PLAN  Fever, vomiting, pneumonia  Plan: Patient with labs and imaging  as dictated above. Patient with community acquired pneumonia, borderline oxygen saturations. She is receiving IV Levaquin and fluids. She has home oxygen, encouraged to wear this at all times. She'll be discharged with Levaquin and cough medication. Follow up with her doctor in 2 days for recheck   Earleen Newport, MD   Earleen Newport, MD 10/02/2014 1313  Earleen Newport, MD 09/29/2014 [phone]

## 2014-10-04 NOTE — Discharge Instructions (Signed)

## 2014-10-04 NOTE — ED Provider Notes (Signed)
[facility] Emergency Department Provider Note  ____________________________________________  Time seen: 350  I have reviewed the triage vital signs and the nursing notes.   HISTORY  Chief Complaint Code Sepsis and Respiratory Distress    HPI Cassidy Gates is a 49 y.o. female who presents with respiratory distress. She was seen in the ER this morning for dizziness and found to have coming apart pneumonia and started on Levaquin. However, throughout the early afternoon at home she has had progressing fever and shortness of breath. She's been using her home oxygen without relief. She feels very thirsty and dehydrated. No chest pain or syncope. She also reports a few episodes of vomiting this morning.    Past Medical History  Diagnosis Date  . CML (chronic myelocytic leukemia)     a. on Bosulif (adverse effects include pericardial efusion, pleural effusion, and edema)   . Hypertension   . DM2 (diabetes mellitus, type 2)   . Chronic diastolic CHF (congestive heart failure)     a. echo: 06/11/2014: EF >55%, mild MR, AI, GR1DD; b. echo 08/2014: EF 60-65%, no RWMA, GR1DD, LA nl, RV mod dilated, wall thickness nl, sys fxn mild to mod reduced, PASP severely elevated 65 mm Hg  . OSA (obstructive sleep apnea)     a. on CPAP  . Obesity   . Heart murmur   . Chronic respiratory failure     Patient Active Problem List   Diagnosis Date Noted  . Chronic respiratory failure   . Chronic diastolic CHF (congestive heart failure)   . HTN (hypertension) 08/21/2014  . DM2 (diabetes mellitus, type 2) 08/21/2014  . AKI (acute kidney injury) 08/21/2014  . CML (chronic myelocytic leukemia) 08/21/2014  . OSA (obstructive sleep apnea) 08/21/2014  . SOB (shortness of breath)   . Morbid obesity     Past Surgical History  Procedure Laterality Date  . Partial hysterectomy    . Abdominal hysterectomy      Current Outpatient Rx  Name  Route  Sig  Dispense  Refill  . bosutinib  (BOSULIF) 500 MG tablet   Oral   Take 500 mg by mouth daily with breakfast.          . docusate sodium (COLACE) 100 MG capsule   Oral   Take 100 mg by mouth daily as needed for mild constipation.         . ferrous sulfate 325 (65 FE) MG tablet   Oral   Take 325 mg by mouth daily with breakfast.         . hydrALAZINE (APRESOLINE) 50 MG tablet   Oral   Take 50 mg by mouth 2 (two) times daily.         Marland Kitchen levocetirizine (XYZAL) 5 MG tablet   Oral   Take 5 mg by mouth every evening.         . lovastatin (MEVACOR) 40 MG tablet   Oral   Take 40 mg by mouth at bedtime.         . metFORMIN (GLUCOPHAGE) 500 MG tablet   Oral   Take 500 mg by mouth every evening.          . metoprolol (TOPROL-XL) 200 MG 24 hr tablet   Oral   Take 200 mg by mouth daily.         . montelukast (SINGULAIR) 10 MG tablet   Oral   Take 10 mg by mouth at bedtime.         Marland Kitchen  potassium chloride SA (K-DUR,KLOR-CON) 20 MEQ tablet   Oral   Take 1 tablet (20 mEq total) by mouth 2 (two) times daily.   30 tablet   3   . ranitidine (ZANTAC) 150 MG tablet   Oral   Take 150 mg by mouth 2 (two) times daily.         Marland Kitchen senna (SENOKOT) 8.6 MG TABS tablet   Oral   Take 1 tablet by mouth daily as needed for mild constipation.         . torsemide (DEMADEX) 20 MG tablet   Oral   Take 20 mg by mouth daily.         . valsartan (DIOVAN) 320 MG tablet   Oral   Take 320 mg by mouth daily.         . vitamin B-12 (CYANOCOBALAMIN) 500 MCG tablet   Oral   Take 500 mcg by mouth daily.         . vitamin C (ASCORBIC ACID) 500 MG tablet   Oral   Take 1,000 mg by mouth daily.         Marland Kitchen zolpidem (AMBIEN) 10 MG tablet   Oral   Take 10 mg by mouth at bedtime as needed for sleep.         . chlorpheniramine-HYDROcodone (TUSSIONEX PENNKINETIC ER) 10-8 MG/5ML SUER   Oral   Take 5 mLs by mouth 2 (two) times daily.   140 mL   0   . levofloxacin (LEVAQUIN) 750 MG tablet   Oral   Take 1  tablet (750 mg total) by mouth daily.   5 tablet   0     Allergies Codeine and Penicillins  Family History  Problem Relation Age of Onset  . Breast cancer Mother   . Heart disease Father   . Hypertension Father   . Hypertension Sister   . Thyroid disease Sister   . Hypertension Brother   . Diabetes Brother     Social History Social History  Substance Use Topics  . Smoking status: Never Smoker   . Smokeless tobacco: Never Used  . Alcohol Use: No    Review of Systems  Constitutional: No fever or chills. No weight changes Eyes:No blurry vision or double vision.  ENT: No sore throat. Cardiovascular: No chest pain. Respiratory: Shortness of breath and nonproductive cough. Gastrointestinal: Mild generalized abdominal pain and vomiting this morning, resolved at present time. No diarrhea  No BRBPR or melena. Genitourinary: Negative for dysuria, urinary retention, bloody urine, or difficulty urinating. Musculoskeletal: Negative for back pain. No joint swelling or pain. Skin: Negative for rash. Neurological: Negative for headaches, focal weakness or numbness. Psychiatric:No anxiety or depression.   Endocrine:No hot/cold intolerance, changes in energy, or sleep difficulty.  10-point ROS otherwise negative.  ____________________________________________   PHYSICAL EXAM:  VITAL SIGNS: ED Triage Vitals  Enc Vitals Group     BP 09/14/2014 1558 102/83 mmHg     Pulse Rate 09/10/2014 1558 122     Resp 09/27/2014 1558 34     Temp 09/23/2014 1558 103.1 F (39.5 C)     Temp Source 09/21/2014 1558 Oral     SpO2 09/25/2014 1558 87 %     Weight 10/03/2014 1558 250 lb (113.399 kg)     Height 09/28/2014 1558 5\' 5"  (1.651 m)     Head Cir --      Peak Flow --      Pain Score 10/01/2014 1644 10  Pain Loc --      Pain Edu? --      Excl. in GC? --   80% on room air, 86% on 2 L nasal cannula   Constitutional: Alert and oriented. Moderate respiratory distress Eyes: No scleral icterus. No  conjunctival pallor. PERRL. EOMI ENT   Head: Normocephalic and atraumatic.   Nose: No congestion/rhinnorhea. No septal hematoma   Mouth/Throat: Dry mucous membranes, no pharyngeal erythema. No peritonsillar mass. No uvula shift.   Neck: No stridor. No SubQ emphysema. No meningismus. Hematological/Lymphatic/Immunilogical: No cervical lymphadenopathy. Cardiovascular: Tachycardia with heart rate 120 to 1:30. Normal and symmetric distal pulses are present in all extremities. No murmurs, rubs, or gallops. Respiratory: Tachypnea with a respiratory rate of about 30. Increased work of breathing. No wheezes or rales. Small amount of crackles at the right base. Normal expiratory phase. Gastrointestinal: Soft and nontender. No distention. There is no CVA tenderness.  No rebound, rigidity, or guarding. Genitourinary: deferred Musculoskeletal: Nontender with normal range of motion in all extremities. No joint effusions.  No lower extremity tenderness.  No edema. Neurologic:   Normal speech and language.  CN 2-10 normal. Motor grossly intact. No pronator drift.  Normal gait. No gross focal neurologic deficits are appreciated.  Skin:  Skin is warm, dry and intact. No rash noted.  No petechiae, purpura, or bullae. Psychiatric: Mood and affect are normal. Speech and behavior are normal. Patient exhibits appropriate insight and judgment.  ____________________________________________    LABS (pertinent positives/negatives) (all labs ordered are listed, but only abnormal results are displayed) Labs Reviewed  LACTIC ACID, PLASMA - Abnormal; Notable for the following:    Lactic Acid, Venous 6.0 (*)    All other components within normal limits  COMPREHENSIVE METABOLIC PANEL - Abnormal; Notable for the following:    CO2 17 (*)    Glucose, Bld 108 (*)    BUN 25 (*)    Creatinine, Ser 1.65 (*)    Total Protein 8.3 (*)    AST 53 (*)    GFR calc non Af Amer 36 (*)    GFR calc Af Amer 42 (*)     Anion gap 16 (*)    All other components within normal limits  LIPASE, BLOOD - Abnormal; Notable for the following:    Lipase 15 (*)    All other components within normal limits  CBC WITH DIFFERENTIAL/PLATELET - Abnormal; Notable for the following:    WBC 3.2 (*)    RBC 5.25 (*)    MCV 79.9 (*)    MCH 25.6 (*)    RDW 15.6 (*)    Platelets 137 (*)    Lymphs Abs 0.8 (*)    Monocytes Absolute 0.1 (*)    All other components within normal limits  CULTURE, BLOOD (ROUTINE X 2)  CULTURE, BLOOD (ROUTINE X 2)  URINE CULTURE  CULTURE, EXPECTORATED SPUTUM-ASSESSMENT  TROPONIN I  APTT  PROTIME-INR  LACTIC ACID, PLASMA  URINALYSIS COMPLETEWITH MICROSCOPIC ([facility] ONLY)   ____________________________________________   EKG  Interpreted by me Sinus tachycardia rate 118, normal axis intervals QRS and ST segments and T waves.  ____________________________________________    RADIOLOGY  Chest x-ray reveals bibasilar opacities which in the current clinical context are consistent with bilateral pneumonia  ____________________________________________   PROCEDURES CRITICAL CARE Performed by: Joni Fears, Phallon Haydu   Total critical care time: 35 minutes  Critical care time was exclusive of separately billable procedures and treating other patients.  Critical care was necessary to treat or prevent imminent  or life-threatening deterioration.  Critical care was time spent personally by me on the following activities: development of treatment plan with patient and/or surrogate as well as nursing, discussions with consultants, evaluation of patient's response to treatment, examination of patient, obtaining history from patient or surrogate, ordering and performing treatments and interventions, ordering and review of laboratory studies, ordering and review of radiographic studies, pulse oximetry and re-evaluation of patient's condition.  ____________________________________________   INITIAL  IMPRESSION / ASSESSMENT AND PLAN / ED COURSE  Pertinent labs & imaging results that were available during my care of the patient were reviewed by me and considered in my medical decision making (see chart for details).  Patient presents with community acquired pneumonia which unfortunately has rapidly progressed to sepsis throughout the day since her evaluation earlier today. She is tachycardic, tachypneic, febrile, hypoxic. We will start IV antibiotics with aztreonam and vancomycin due to penicillin allergy, and aggressive IV fluid resuscitation, and plan for admission. Blood cultures lactate at the ordered. Low suspicion for PE as a cause for her shortness of breath and vital sign abnormalities.  ----------------------------------------- 4:51 PM on 09/12/2014 -----------------------------------------  At 4:00 her saturation was 86% on 2 L so she was increased to 3 L. It was further increased to 4 L. At 4:40 PM, her saturation was still only 88% on 4 L was increased to 5 L. It is now 90%. Blood pressure remained stable, mental status remains appropriate. Will follow up results and admit for further management.  ----------------------------------------- 5:18 PM on 09/19/2014 -----------------------------------------  Blood pressure remains stable. Oxygen saturation remains around 89-90% on 5-6 L nasal cannula. Tachycardia has improved to 110 with IV fluids so far. Labs significant for a lactic acid level of 6.0 and a small anion gap acidosis. We'll continue to monitor, pending admission to the hospital. ____________________________________________   FINAL CLINICAL IMPRESSION(S) / ED DIAGNOSES  Final diagnoses:  CAP (community acquired pneumonia)  Sepsis, due to unspecified organism      Carrie Mew, MD 09/26/2014 1720

## 2014-10-05 DIAGNOSIS — I509 Heart failure, unspecified: Secondary | ICD-10-CM

## 2014-10-05 DIAGNOSIS — R110 Nausea: Secondary | ICD-10-CM

## 2014-10-05 DIAGNOSIS — G4730 Sleep apnea, unspecified: Secondary | ICD-10-CM

## 2014-10-05 DIAGNOSIS — C9210 Chronic myeloid leukemia, BCR/ABL-positive, not having achieved remission: Secondary | ICD-10-CM

## 2014-10-05 DIAGNOSIS — E119 Type 2 diabetes mellitus without complications: Secondary | ICD-10-CM

## 2014-10-05 DIAGNOSIS — I517 Cardiomegaly: Secondary | ICD-10-CM

## 2014-10-05 DIAGNOSIS — R531 Weakness: Secondary | ICD-10-CM

## 2014-10-05 DIAGNOSIS — A419 Sepsis, unspecified organism: Secondary | ICD-10-CM

## 2014-10-05 DIAGNOSIS — Z803 Family history of malignant neoplasm of breast: Secondary | ICD-10-CM

## 2014-10-05 DIAGNOSIS — R05 Cough: Secondary | ICD-10-CM

## 2014-10-05 DIAGNOSIS — J189 Pneumonia, unspecified organism: Secondary | ICD-10-CM

## 2014-10-05 DIAGNOSIS — I10 Essential (primary) hypertension: Secondary | ICD-10-CM

## 2014-10-05 DIAGNOSIS — R5383 Other fatigue: Secondary | ICD-10-CM

## 2014-10-05 DIAGNOSIS — J9601 Acute respiratory failure with hypoxia: Secondary | ICD-10-CM

## 2014-10-05 DIAGNOSIS — R011 Cardiac murmur, unspecified: Secondary | ICD-10-CM

## 2014-10-05 DIAGNOSIS — E669 Obesity, unspecified: Secondary | ICD-10-CM

## 2014-10-05 LAB — GLUCOSE, CAPILLARY
GLUCOSE-CAPILLARY: 132 mg/dL — AB (ref 65–99)
Glucose-Capillary: 177 mg/dL — ABNORMAL HIGH (ref 65–99)
Glucose-Capillary: 120 mg/dL — ABNORMAL HIGH (ref 65–99)
Glucose-Capillary: 122 mg/dL — ABNORMAL HIGH (ref 65–99)

## 2014-10-05 LAB — BASIC METABOLIC PANEL
ANION GAP: 12 (ref 5–15)
BUN: 36 mg/dL — ABNORMAL HIGH (ref 6–20)
CALCIUM: 8.7 mg/dL — AB (ref 8.9–10.3)
CHLORIDE: 105 mmol/L (ref 101–111)
CO2: 17 mmol/L — AB (ref 22–32)
Creatinine, Ser: 2.63 mg/dL — ABNORMAL HIGH (ref 0.44–1.00)
GFR calc non Af Amer: 21 mL/min — ABNORMAL LOW (ref >60)
GFR, EST AFRICAN AMERICAN: 24 mL/min — AB (ref >60)
GLUCOSE: 155 mg/dL — AB (ref 65–99)
POTASSIUM: 5.2 mmol/L — AB (ref 3.5–5.1)
Sodium: 134 mmol/L — ABNORMAL LOW (ref 135–145)

## 2014-10-05 LAB — URINALYSIS COMPLETE WITH MICROSCOPIC (ARMC ONLY)
Bilirubin Urine: NEGATIVE
GLUCOSE, UA: NEGATIVE mg/dL
Ketones, ur: NEGATIVE mg/dL
NITRITE: POSITIVE — AB
Protein, ur: 30 mg/dL — AB
SPECIFIC GRAVITY, URINE: 1.014 (ref 1.005–1.030)
pH: 5 (ref 5.0–8.0)

## 2014-10-05 LAB — CBC
HEMATOCRIT: 36.3 % (ref 35.0–47.0)
HEMOGLOBIN: 11.6 g/dL — AB (ref 12.0–16.0)
MCH: 25.6 pg — AB (ref 26.0–34.0)
MCHC: 32.1 g/dL (ref 32.0–36.0)
MCV: 79.9 fL — AB (ref 80.0–100.0)
Platelets: 119 10*3/uL — ABNORMAL LOW (ref 150–440)
RBC: 4.54 MIL/uL (ref 3.80–5.20)
RDW: 15.8 % — ABNORMAL HIGH (ref 11.5–14.5)
WBC: 26 10*3/uL — ABNORMAL HIGH (ref 3.6–11.0)

## 2014-10-05 LAB — RAPID HIV SCREEN (HIV 1/2 AB+AG)
HIV 1/2 Antibodies: NONREACTIVE
HIV-1 P24 Antigen - HIV24: NONREACTIVE

## 2014-10-05 MED ORDER — LEVOFLOXACIN IN D5W 750 MG/150ML IV SOLN
750.0000 mg | INTRAVENOUS | Status: DC
  Filled 2014-10-05: qty 150

## 2014-10-05 MED ORDER — SODIUM CHLORIDE 0.9 % IV SOLN
INTRAVENOUS | Status: DC
  Administered 2014-10-05 (×2): via INTRAVENOUS

## 2014-10-05 MED ORDER — OXYCODONE-ACETAMINOPHEN 5-325 MG PO TABS
1.0000 | ORAL_TABLET | ORAL | Status: DC | PRN
  Administered 2014-10-05: 1 via ORAL
  Administered 2014-10-06: 2 via ORAL
  Filled 2014-10-05: qty 1
  Filled 2014-10-05: qty 2

## 2014-10-05 MED ORDER — DEXTROSE 5 % IV SOLN
2.0000 g | Freq: Three times a day (TID) | INTRAVENOUS | Status: DC
  Administered 2014-10-05 (×2): 2 g via INTRAVENOUS
  Filled 2014-10-05 (×7): qty 2

## 2014-10-05 MED ORDER — ALPRAZOLAM 0.25 MG PO TABS
0.2500 mg | ORAL_TABLET | Freq: Two times a day (BID) | ORAL | Status: DC | PRN
  Administered 2014-10-05 (×2): 0.25 mg via ORAL
  Filled 2014-10-05 (×2): qty 1

## 2014-10-05 MED ORDER — ONDANSETRON HCL 4 MG/2ML IJ SOLN
4.0000 mg | INTRAMUSCULAR | Status: DC | PRN
  Administered 2014-10-05 – 2014-10-06 (×2): 4 mg via INTRAVENOUS
  Filled 2014-10-05: qty 2

## 2014-10-05 MED ORDER — ONDANSETRON HCL 4 MG/2ML IJ SOLN
INTRAMUSCULAR | Status: AC
  Administered 2014-10-05: 4 mg via INTRAVENOUS
  Filled 2014-10-05: qty 2

## 2014-10-05 NOTE — Progress Notes (Signed)
ANTIBIOTIC CONSULT NOTE - INITIAL  Pharmacy Consult for renal dosing of antibiotics:  Aztreonam/Levaquin  Indication: pneumonia  Allergies  Allergen Reactions  . Codeine Hives  . Penicillins Hives    Patient Measurements: Height: 5\' 5"  (165.1 cm) Weight: 250 lb (113.399 kg) IBW/kg (Calculated) : 57 Adjusted Body Weight: 79.6 kg  Vital Signs: Temp: 98 F (36.7 C) (08/27 0830) Temp Source: Axillary (08/27 0830) BP: 82/52 mmHg (08/27 1300) Pulse Rate: 98 (08/27 1300) Intake/Output from previous day: 08/26 0701 - 08/27 0700 In: 860 [P.O.:360; I.V.:250; IV Piggyback:250] Out: -  Intake/Output from this shift: Total I/O In: 150 [IV Piggyback:150] Out: -   Labs:  Recent Labs  09/11/2014 1136 09/28/2014 1609 10/05/14 0646  WBC 12.8* 3.2* 26.0*  HGB 12.9 13.4 11.6*  PLT 153 137* 119*  CREATININE 1.43* 1.65* 2.63*   Estimated Creatinine Clearance: 33.2 mL/min (by C-G formula based on Cr of 2.63). No results for input(s): VANCOTROUGH, VANCOPEAK, VANCORANDOM, GENTTROUGH, GENTPEAK, GENTRANDOM, TOBRATROUGH, TOBRAPEAK, TOBRARND, AMIKACINPEAK, AMIKACINTROU, AMIKACIN in the last 72 hours.   Microbiology: Recent Results (from the past 720 hour(s))  Blood culture (routine x 2)     Status: None (Preliminary result)   Collection Time: 10/03/2014 11:36 AM  Result Value Ref Range Status   Specimen Description BLOOD LEFT HAND  Final   Special Requests BOTTLES DRAWN AEROBIC AND ANAEROBIC  5CC  Final   Culture  Setup Time   Final    GRAM NEGATIVE RODS AEROBIC BOTTLE ONLY CRITICAL RESULT CALLED TO, READ BACK BY AND VERIFIED WITH: CATHY DUNN AT 2671 ON 10/05/14 RWW    Culture   Final    GRAM NEGATIVE RODS AEROBIC BOTTLE ONLY IDENTIFICATION TO FOLLOW    Report Status PENDING  Incomplete  Blood culture (routine x 2)     Status: None (Preliminary result)   Collection Time: 10/07/2014 11:37 AM  Result Value Ref Range Status   Specimen Description BLOOD LEFT ASSIST CONTROL  Final    Special Requests BOTTLES DRAWN AEROBIC AND ANAEROBIC  4CC  Final   Culture  Setup Time   Final    GRAM NEGATIVE RODS AEROBIC BOTTLE ONLY CRITICAL RESULT CALLED TO, READ BACK BY AND VERIFIED WITH: CATHY DUNN AT 0530 0N 10/05/14/RWW    Culture   Final    GRAM NEGATIVE RODS AEROBIC BOTTLE ONLY IDENTIFICATION TO FOLLOW    Report Status PENDING  Incomplete  Blood Culture (routine x 2)     Status: None (Preliminary result)   Collection Time: 09/18/2014  4:09 PM  Result Value Ref Range Status   Specimen Description BLOOD LEFT ASSIST CONTROL  Final   Special Requests   Final    BOTTLES DRAWN AEROBIC AND ANAEROBIC  AER [place] ANA 2CC   Culture  Setup Time   Final    GRAM NEGATIVE RODS IN BOTH AEROBIC AND ANAEROBIC BOTTLES CRITICAL RESULT CALLED TO, READ BACK BY AND VERIFIED WITH: CATHY DUNN AT 0530 ON 10/05/14 RWW    Culture   Final    GRAM NEGATIVE RODS IN BOTH AEROBIC AND ANAEROBIC BOTTLES IDENTIFICATION TO FOLLOW    Report Status PENDING  Incomplete  Blood Culture (routine x 2)     Status: None (Preliminary result)   Collection Time: 09/11/2014  4:16 PM  Result Value Ref Range Status   Specimen Description BLOOD RIGHT ASSIST CONTROL  Final   Special Requests   Final    BOTTLES DRAWN AEROBIC AND ANAEROBIC  AER [place] ANA 3CC   Culture  Setup Time   Final    GRAM NEGATIVE RODS IN BOTH AEROBIC AND ANAEROBIC BOTTLES CRITICAL RESULT CALLED TO, READ BACK BY AND VERIFIED WITH: CATHY DUNN AT 0530 ON 10/05/14 RWW    Culture   Final    GRAM NEGATIVE RODS IN BOTH AEROBIC AND ANAEROBIC BOTTLES IDENTIFICATION TO FOLLOW    Report Status PENDING  Incomplete  MRSA PCR Screening     Status: None   Collection Time: 09/26/2014  9:23 PM  Result Value Ref Range Status   MRSA by PCR NEGATIVE NEGATIVE Final    Comment:        The GeneXpert MRSA Assay (FDA approved for NASAL specimens only), is one component of a comprehensive MRSA colonization surveillance program. It is not intended to diagnose  MRSA infection nor to guide or monitor treatment for MRSA infections.     Medical History: Past Medical History  Diagnosis Date  . CML (chronic myelocytic leukemia)     a. on Bosulif (adverse effects include pericardial efusion, pleural effusion, and edema)   . Hypertension   . DM2 (diabetes mellitus, type 2)   . Chronic diastolic CHF (congestive heart failure)     a. echo: 06/11/2014: EF >55%, mild MR, AI, GR1DD; b. echo 08/2014: EF 60-65%, no RWMA, GR1DD, LA nl, RV mod dilated, wall thickness nl, sys fxn mild to mod reduced, PASP severely elevated 65 mm Hg  . OSA (obstructive sleep apnea)     a. on CPAP  . Obesity   . Heart murmur   . Chronic respiratory failure     Medications:  Anti-infectives    Start     Dose/Rate Route Frequency Ordered Stop   10/07/14 1000  levofloxacin (LEVAQUIN) IVPB 750 mg     750 mg 100 mL/hr over 90 Minutes Intravenous Every 48 hours 10/05/14 1340     10/05/14 1400  aztreonam (AZACTAM) 2 g in dextrose 5 % 50 mL IVPB     2 g 100 mL/hr over 30 Minutes Intravenous 3 times per day 10/05/14 1334     10/05/14 1000  levofloxacin (LEVAQUIN) IVPB 750 mg  Status:  Discontinued     750 mg 100 mL/hr over 90 Minutes Intravenous Every 24 hours 09/30/2014 2117 10/05/14 1340   10/05/14 0000  vancomycin (VANCOCIN) 1,250 mg in sodium chloride 0.9 % 250 mL IVPB  Status:  Discontinued     1,250 mg 166.7 mL/hr over 90 Minutes Intravenous Every 18 hours 10/01/2014 2117 10/05/14 1301   09/29/2014 1615  aztreonam (AZACTAM) 2 g in dextrose 5 % 50 mL IVPB     2 g 100 mL/hr over 30 Minutes Intravenous  Once 10/09/2014 1605 09/21/2014 1821   09/25/2014 1615  vancomycin (VANCOCIN) IVPB 1000 mg/200 mL premix     1,000 mg 200 mL/hr over 60 Minutes Intravenous  Once 09/16/2014 1605 09/28/2014 1739     Assessment: Patient admitted for possible pneumonia. Empirically started on Levaquin and Vancomycin. Per MD notes on 8/27, D/C Vanc and continue Levaquin and Aztreonam.    Goal of Therapy:   Resolution of Infection  Plan:  Measure antibiotic drug levels at steady state Follow up culture results   Levaquin 750mg  IV q24h transitioned to Q48H dosing based on current renal function.   Aztreonam 2g IV Q8H ordered.    Follow renal function and adjust as per consult.    Olivia Canter, [facility] 10/05/2014

## 2014-10-05 NOTE — Consult Note (Signed)
[place]  [phone]  [phone]     PEARLE WANDLER DOB: 1965-07-22  MR#: [id number]  KXF#:[id number]  Patient Care Team: [name], MD as PCP - General (Internal Medicine) Alisa Graff, FNP as Nurse Practitioner (Family Medicine) Rise Mu, PA-C as Physician Assistant (Cardiology)  CHIEF COMPLAINT:  Chief Complaint  Patient presents with  . Code Sepsis  . Respiratory Distress   Patient with known past medical history significant for CML. Currently being followed by Dr. Grayland Ormond and is taking Bosulif for nearly 2 years now.  INTERVAL HISTORY:  Patient is a 49 year old female who was recently admitted to the hospital with increasing shortness of breath, cough. She has been admitted to the CCU for hypoxic respiratory failure. Blood cultures showed gram-negative rods. She continues feeling very weak, with cough, and discomfort and back. She denies any current fevers or chills. She is currently trying to eat lunch but is unable reports that she feels nauseous. Specifically requesting some soup or something lighter. Patient was recently seen by Dr. Grayland Ormond in [place] in July 2016 for follow-up regarding CML, chronic myelocytic leukemia. She has been tolerating recent medication, Bosulif, for nearly 2 years now. Previously unable to tolerate Gleevec or Sprycel. Admitted  REVIEW OF SYSTEMS:   Review of Systems  Constitutional: Positive for malaise/fatigue. Negative for fever, chills, weight loss and diaphoresis.  HENT: Negative for congestion, ear discharge, ear pain, hearing loss, nosebleeds, sore throat and tinnitus.   Eyes: Negative for blurred vision, double vision, photophobia, pain, discharge and redness.  Respiratory: Positive for cough and shortness of breath. Negative for hemoptysis, sputum production, wheezing and stridor.   Cardiovascular: Negative for chest pain, palpitations, orthopnea, claudication, leg swelling and PND.    Gastrointestinal: Positive for nausea. Negative for heartburn, vomiting, abdominal pain, diarrhea, constipation, blood in stool and melena.  Genitourinary: Negative.   Musculoskeletal: Negative.   Skin: Negative.   Neurological: Positive for weakness. Negative for dizziness, tingling, focal weakness, seizures and headaches.  Endo/Heme/Allergies: Does not bruise/bleed easily.  Psychiatric/Behavioral: Negative for depression. The patient is not nervous/anxious and does not have insomnia.     As per HPI. Otherwise, a complete review of systems is negatve.   PAST MEDICAL HISTORY: Past Medical History  Diagnosis Date  . CML (chronic myelocytic leukemia)     a. on Bosulif (adverse effects include pericardial efusion, pleural effusion, and edema)   . Hypertension   . DM2 (diabetes mellitus, type 2)   . Chronic diastolic CHF (congestive heart failure)     a. echo: 06/11/2014: EF >55%, mild MR, AI, GR1DD; b. echo 08/2014: EF 60-65%, no RWMA, GR1DD, LA nl, RV mod dilated, wall thickness nl, sys fxn mild to mod reduced, PASP severely elevated 65 mm Hg  . OSA (obstructive sleep apnea)     a. on CPAP  . Obesity   . Heart murmur   . Chronic respiratory failure     PAST SURGICAL HISTORY: Past Surgical History  Procedure Laterality Date  . Partial hysterectomy    . Abdominal hysterectomy      FAMILY HISTORY Family History  Problem Relation Age of Onset  . Breast cancer Mother   . Heart disease Father   . Hypertension Father   . Hypertension Sister   . Thyroid disease Sister   . Hypertension Brother   . Diabetes Brother     GYNECOLOGIC HISTORY:  No LMP recorded (lmp unknown). Patient has had a hysterectomy.  ADVANCED DIRECTIVES:    HEALTH MAINTENANCE: Social History  Substance Use Topics  . Smoking status: Never Smoker   . Smokeless tobacco: Never Used  . Alcohol Use: No     Colonoscopy:  PAP:  Bone density:  Lipid panel:  Allergies  Allergen Reactions  . Codeine  Hives  . Penicillins Hives    Current Facility-Administered Medications  Medication Dose Route Frequency Provider Last Rate Last Dose  . 0.9 %  sodium chloride infusion   Intravenous Continuous Adrian Prows, MD      . acetaminophen (TYLENOL) tablet 650 mg  650 mg Oral Q4H PRN Adrian Prows, MD   650 mg at 10/05/14 0859  . ALPRAZolam Duanne Moron) tablet 0.25 mg  0.25 mg Oral BID PRN Lytle Butte, MD   0.25 mg at 10/05/14 0404  . bosutinib (BOSULIF) tablet 500 mg  500 mg Oral Q breakfast Sital Mody, MD   500 mg at 10/05/14 0800  . cyanocobalamin tablet 500 mcg  500 mcg Oral Daily Bettey Costa, MD   500 mcg at 10/05/14 1046  . docusate sodium (COLACE) capsule 100 mg  100 mg Oral Daily PRN Bettey Costa, MD      . enoxaparin (LOVENOX) injection 40 mg  40 mg Subcutaneous Q24H Bettey Costa, MD   40 mg at 09/27/2014 2138  . ferrous sulfate tablet 325 mg  325 mg Oral Q breakfast Bettey Costa, MD   325 mg at 10/05/14 0850  . hydrALAZINE (APRESOLINE) injection 10 mg  10 mg Intravenous Q6H PRN Sital Mody, MD      . insulin aspart (novoLOG) injection 0-5 Units  0-5 Units Subcutaneous QHS Bettey Costa, MD   0 Units at 10/07/2014 2125  . insulin aspart (novoLOG) injection 0-9 Units  0-9 Units Subcutaneous TID WC Bettey Costa, MD   2 Units at 10/05/14 1154  . levofloxacin (LEVAQUIN) IVPB 750 mg  750 mg Intravenous Q24H Sital Mody, MD   750 mg at 10/05/14 1045  . loratadine (CLARITIN) tablet 10 mg  10 mg Oral QHS Bettey Costa, MD   10 mg at 10/05/2014 2142  . montelukast (SINGULAIR) tablet 10 mg  10 mg Oral QHS Bettey Costa, MD   10 mg at 09/16/2014 2138  . oxyCODONE-acetaminophen (PERCOCET/ROXICET) 5-325 MG per tablet 1-2 tablet  1-2 tablet Oral Q4H PRN Adrian Prows, MD      . pravastatin (PRAVACHOL) tablet 10 mg  10 mg Oral q1800 Bettey Costa, MD      . senna (SENOKOT) tablet 8.6 mg  1 tablet Oral Daily PRN Bettey Costa, MD      . torsemide (DEMADEX) tablet 20 mg  20 mg Oral Daily Bettey Costa, MD   20 mg at 10/05/14 1046  .  traMADol (ULTRAM) tablet 50 mg  50 mg Oral Q6H PRN Adrian Prows, MD   50 mg at 10/05/14 1056  . vitamin C (ASCORBIC ACID) tablet 1,000 mg  1,000 mg Oral Daily Bettey Costa, MD   1,000 mg at 10/05/14 1047  . zolpidem (AMBIEN) tablet 10 mg  10 mg Oral QHS PRN Bettey Costa, MD   10 mg at 10/05/14 0247    OBJECTIVE: BP 97/59 mmHg  Pulse 94  Temp(Src) 98 F (36.7 C) (Axillary)  Resp 44  Ht 5' 5"  (1.651 m)  Wt 250 lb (113.399 kg)  BMI 41.60 kg/m2  SpO2 96%  LMP  (LMP Unknown)   Body mass index is 41.6 kg/(m^2).    ECOG FS:3 - Symptomatic, >50% confined to  bed   General: Well-developed, well-nourished, no acute distress. Sitting in bed eating lunch. HEENT: Normocephalic, moist mucous membranes, clear oropharnyx. Lungs: Bilateral diminished breath sounds at bases Heart: Regular rate and rhythm Abdomen: Soft, nontender, nondistended. No organomegaly noted, normoactive bowel sounds. Breast: Breast palpated in a circular manner in the sitting and supine positions.  No masses or fullness palpated.  Axilla palpated in both positions with no masses or fullness palpated.  Musculoskeletal: No edema, cyanosis, or clubbing. Neuro: Alert, answering all questions appropriately. Cranial nerves grossly intact. Skin: No rashes or petechiae noted. Psych: Normal affect.    LAB RESULTS:  Admission on 09/12/2014  Component Date Value Ref Range Status  . Lactic Acid, Venous 09/20/2014 6.0* 0.5 - 2.0 mmol/L Final   Comment: CRITICAL RESULT CALLED TO, READ BACK BY AND VERIFIED WITH  [facility] YUAL AT 1702 09/30/2014 SDR   . Lactic Acid, Venous 09/22/2014 4.9* 0.5 - 2.0 mmol/L Final   Comment: CRITICAL RESULT CALLED TO, READ BACK BY AND VERIFIED WITH  [facility] E[facility]U AT 2149 10/01/2014   . Sodium 09/22/2014 136  135 - 145 mmol/L Final  . Potassium 10/05/2014 4.1  3.5 - 5.1 mmol/L Final  . Chloride 09/30/2014 103  101 - 111 mmol/L Final  . CO2 09/22/2014 17* 22 - 32 mmol/L Final  . Glucose, Bld 09/15/2014 108* 65 -  99 mg/dL Final  . BUN 09/25/2014 25* 6 - 20 mg/dL Final  . Creatinine, Ser 09/15/2014 1.65* 0.44 - 1.00 mg/dL Final  . Calcium 09/22/2014 9.7  8.9 - 10.3 mg/dL Final  . Total Protein 09/25/2014 8.3* 6.5 - 8.1 g/dL Final  . Albumin 09/22/2014 4.2  3.5 - 5.0 g/dL Final  . AST 09/10/2014 53* 15 - 41 U/L Final  . ALT 09/26/2014 19  14 - 54 U/L Final  . Alkaline Phosphatase 10/09/2014 88  38 - 126 U/L Final  . Total Bilirubin 09/29/2014 1.0  0.3 - 1.2 mg/dL Final  . GFR calc non Af Amer 09/22/2014 36* >60 mL/min Final  . GFR calc Af Amer 09/09/2014 42* >60 mL/min Final   Comment: (NOTE) The eGFR has been calculated using the CKD EPI equation. This calculation has not been validated in all clinical situations. eGFR's persistently <60 mL/min signify possible Chronic Kidney Disease.   . Anion gap 09/24/2014 16* 5 - 15 Final  . Lipase 09/30/2014 15* 22 - 51 U/L Final  . Troponin I 09/24/2014 <0.03  <0.031 ng/mL Final   Comment:        NO INDICATION OF MYOCARDIAL INJURY.   . WBC 09/16/2014 3.2* 3.6 - 11.0 K/uL Final  . RBC 09/17/2014 5.25* 3.80 - 5.20 MIL/uL Final  . Hemoglobin 10/09/2014 13.4  12.0 - 16.0 g/dL Final  . HCT 10/09/2014 42.0  35.0 - 47.0 % Final  . MCV 09/28/2014 79.9* 80.0 - 100.0 fL Final  . MCH 09/23/2014 25.6* 26.0 - 34.0 pg Final  . MCHC 09/27/2014 32.0  32.0 - 36.0 g/dL Final  . RDW 10/02/2014 15.6* 11.5 - 14.5 % Final  . Platelets 09/14/2014 137* 150 - 440 K/uL Final  . Neutrophils Relative % 09/19/2014 74   Final  . Neutro Abs 10/07/2014 2.4  1.4 - 6.5 K/uL Final  . Lymphocytes Relative 09/21/2014 24   Final  . Lymphs Abs 09/11/2014 0.8* 1.0 - 3.6 K/uL Final  . Monocytes Relative 09/29/2014 2   Final  . Monocytes Absolute 10/08/2014 0.1* 0.2 - 0.9 K/uL Final  . Eosinophils Relative 09/30/2014 0  Final  . Eosinophils Absolute 09/28/2014 0.0  0 - 0.7 K/uL Final  . Basophils Relative 09/12/2014 0   Final  . Basophils Absolute 09/28/2014 0.0  0 - 0.1 K/uL Final    . aPTT 09/14/2014 32  24 - 36 seconds Final  . Prothrombin Time 09/20/2014 14.6  11.4 - 15.0 seconds Final  . INR 09/13/2014 1.12   Final  . Specimen Description 09/30/2014 BLOOD LEFT ASSIST CONTROL   Final  . Special Requests 09/24/2014 BOTTLES DRAWN AEROBIC AND ANAEROBIC  AER [place] ANA [place]   Final  . Culture  Setup Time 09/10/2014    Final                   Value:GRAM NEGATIVE RODS IN BOTH AEROBIC AND ANAEROBIC BOTTLES CRITICAL RESULT CALLED TO, READ BACK BY AND VERIFIED WITH: CATHY DUNN AT 0530 ON 10/05/14 RWW   . Culture 09/18/2014    Final                   Value:GRAM NEGATIVE RODS IN BOTH AEROBIC AND ANAEROBIC BOTTLES IDENTIFICATION TO FOLLOW   . Report Status 09/09/2014 PENDING   Incomplete  . Specimen Description 09/09/2014 BLOOD RIGHT ASSIST CONTROL   Final  . Special Requests 10/07/2014 BOTTLES DRAWN AEROBIC AND ANAEROBIC  AER [place] ANA 3CC   Final  . Culture  Setup Time 10/01/2014    Final                   Value:GRAM NEGATIVE RODS IN BOTH AEROBIC AND ANAEROBIC BOTTLES CRITICAL RESULT CALLED TO, READ BACK BY AND VERIFIED WITH: CATHY DUNN AT 0530 ON 10/05/14 RWW   . Culture 10/09/2014    Final                   Value:GRAM NEGATIVE RODS IN BOTH AEROBIC AND ANAEROBIC BOTTLES IDENTIFICATION TO FOLLOW   . Report Status 09/20/2014 PENDING   Incomplete  . Color, Urine 10/05/2014 YELLOW* YELLOW Final  . APPearance 10/05/2014 HAZY* CLEAR Final  . Glucose, UA 10/05/2014 NEGATIVE  NEGATIVE mg/dL Final  . Bilirubin Urine 10/05/2014 NEGATIVE  NEGATIVE Final  . Ketones, ur 10/05/2014 NEGATIVE  NEGATIVE mg/dL Final  . Specific Gravity, Urine 10/05/2014 1.014  1.005 - 1.030 Final  . Hgb urine dipstick 10/05/2014 2+* NEGATIVE Final  . pH 10/05/2014 5.0  5.0 - 8.0 Final  . Protein, ur 10/05/2014 30* NEGATIVE mg/dL Final  . Nitrite 10/05/2014 POSITIVE* NEGATIVE Final  . Leukocytes, UA 10/05/2014 3+* NEGATIVE Final  . RBC / HPF 10/05/2014 6-30  0 - 5 RBC/hpf Final  . WBC, UA 10/05/2014  6-30  0 - 5 WBC/hpf Final  . Bacteria, UA 10/05/2014 MANY* NONE SEEN Final  . Squamous Epithelial / LPF 10/05/2014 0-5* NONE SEEN Final  . Mucous 10/05/2014 PRESENT   Final  . Amorphous Crystal 10/05/2014 PRESENT   Final  . Sodium 10/05/2014 134* 135 - 145 mmol/L Final  . Potassium 10/05/2014 5.2* 3.5 - 5.1 mmol/L Final  . Chloride 10/05/2014 105  101 - 111 mmol/L Final  . CO2 10/05/2014 17* 22 - 32 mmol/L Final  . Glucose, Bld 10/05/2014 155* 65 - 99 mg/dL Final  . BUN 10/05/2014 36* 6 - 20 mg/dL Final  . Creatinine, Ser 10/05/2014 2.63* 0.44 - 1.00 mg/dL Final  . Calcium 10/05/2014 8.7* 8.9 - 10.3 mg/dL Final  . GFR calc non Af Amer 10/05/2014 21* >60 mL/min Final  . GFR calc Af Amer 10/05/2014 24* >  60 mL/min Final   Comment: (NOTE) The eGFR has been calculated using the CKD EPI equation. This calculation has not been validated in all clinical situations. eGFR's persistently <60 mL/min signify possible Chronic Kidney Disease.   . Anion gap 10/05/2014 12  5 - 15 Final  . WBC 10/05/2014 26.0* 3.6 - 11.0 K/uL Final  . RBC 10/05/2014 4.54  3.80 - 5.20 MIL/uL Final  . Hemoglobin 10/05/2014 11.6* 12.0 - 16.0 g/dL Final  . HCT 10/05/2014 36.3  35.0 - 47.0 % Final  . MCV 10/05/2014 79.9* 80.0 - 100.0 fL Final  . MCH 10/05/2014 25.6* 26.0 - 34.0 pg Final  . MCHC 10/05/2014 32.1  32.0 - 36.0 g/dL Final  . RDW 10/05/2014 15.8* 11.5 - 14.5 % Final  . Platelets 10/05/2014 119* 150 - 440 K/uL Final  . Glucose-Capillary 09/28/2014 85  65 - 99 mg/dL Final  . Comment 1 09/19/2014 Notify RN   Final  . MRSA by PCR 09/15/2014 NEGATIVE  NEGATIVE Final   Comment:        The GeneXpert MRSA Assay (FDA approved for NASAL specimens only), is one component of a comprehensive MRSA colonization surveillance program. It is not intended to diagnose MRSA infection nor to guide or monitor treatment for MRSA infections.   Marland Kitchen HIV-1 P24 Antigen - HIV24 10/05/2014 NON REACTIVE  NON REACTIVE Final  . HIV  1/2 Antibodies 10/05/2014 NON REACTIVE  NON REACTIVE Final  . Interpretation (HIV Ag Ab) 10/05/2014 A non reactive test result means that HIV 1 or HIV 2 antibodies and HIV 1 p24 antigen were not detected in the specimen.   Final  . Glucose-Capillary 10/05/2014 132* 65 - 99 mg/dL Final  . Glucose-Capillary 10/05/2014 177* 65 - 99 mg/dL Final  Admission on 09/25/2014, Discharged on 09/22/2014  Component Date Value Ref Range Status  . WBC 09/21/2014 12.8* 3.6 - 11.0 K/uL Final  . RBC 09/30/2014 4.98  3.80 - 5.20 MIL/uL Final  . Hemoglobin 10/09/2014 12.9  12.0 - 16.0 g/dL Final  . HCT 09/26/2014 39.6  35.0 - 47.0 % Final  . MCV 09/25/2014 79.5* 80.0 - 100.0 fL Final  . MCH 09/25/2014 25.9* 26.0 - 34.0 pg Final  . MCHC 09/21/2014 32.5  32.0 - 36.0 g/dL Final  . RDW 09/28/2014 15.3* 11.5 - 14.5 % Final  . Platelets 09/17/2014 153  150 - 440 K/uL Final  . Neutrophils Relative % 10/09/2014 86   Final  . Neutro Abs 10/08/2014 11.0* 1.4 - 6.5 K/uL Final  . Lymphocytes Relative 10/05/2014 4   Final  . Lymphs Abs 10/07/2014 0.4* 1.0 - 3.6 K/uL Final  . Monocytes Relative 09/17/2014 10   Final  . Monocytes Absolute 09/23/2014 1.2* 0.2 - 0.9 K/uL Final  . Eosinophils Relative 09/29/2014 0   Final  . Eosinophils Absolute 09/23/2014 0.0  0 - 0.7 K/uL Final  . Basophils Relative 09/26/2014 0   Final  . Basophils Absolute 09/11/2014 0.0  0 - 0.1 K/uL Final  . Sodium 09/14/2014 136  135 - 145 mmol/L Final  . Potassium 09/09/2014 3.9  3.5 - 5.1 mmol/L Final  . Chloride 10/08/2014 103  101 - 111 mmol/L Final  . CO2 09/24/2014 23  22 - 32 mmol/L Final  . Glucose, Bld 09/14/2014 117* 65 - 99 mg/dL Final  . BUN 09/26/2014 24* 6 - 20 mg/dL Final  . Creatinine, Ser 10/05/2014 1.43* 0.44 - 1.00 mg/dL Final  . Calcium 09/18/2014 9.8  8.9 - 10.3 mg/dL Final  .  Total Protein 09/24/2014 8.1  6.5 - 8.1 g/dL Final  . Albumin 10/03/2014 4.3  3.5 - 5.0 g/dL Final  . AST 09/28/2014 34  15 - 41 U/L Final  . ALT  10/09/2014 21  14 - 54 U/L Final  . Alkaline Phosphatase 09/24/2014 88  38 - 126 U/L Final  . Total Bilirubin 10/01/2014 0.9  0.3 - 1.2 mg/dL Final  . GFR calc non Af Amer 10/09/2014 43* >60 mL/min Final  . GFR calc Af Amer 10/01/2014 50* >60 mL/min Final   Comment: (NOTE) The eGFR has been calculated using the CKD EPI equation. This calculation has not been validated in all clinical situations. eGFR's persistently <60 mL/min signify possible Chronic Kidney Disease.   . Anion gap 09/29/2014 10  5 - 15 Final  . Lipase 09/16/2014 19* 22 - 51 U/L Final  . Troponin I 10/09/2014 <0.03  <0.031 ng/mL Final   Comment:        NO INDICATION OF MYOCARDIAL INJURY.   Marland Kitchen Specimen Description 09/09/2014 BLOOD LEFT HAND   Final  . Special Requests 09/14/2014 BOTTLES DRAWN AEROBIC AND ANAEROBIC  5CC   Final  . Culture  Setup Time 10/08/2014    Final                   Value:GRAM NEGATIVE RODS AEROBIC BOTTLE ONLY CRITICAL RESULT CALLED TO, READ BACK BY AND VERIFIED WITH: CATHY DUNN AT 2536 ON 10/05/14 RWW   . Culture 09/19/2014    Final                   Value:GRAM NEGATIVE RODS AEROBIC BOTTLE ONLY IDENTIFICATION TO FOLLOW   . Report Status 09/11/2014 PENDING   Incomplete  . Specimen Description 10/01/2014 BLOOD LEFT ASSIST CONTROL   Final  . Special Requests 09/14/2014 BOTTLES DRAWN AEROBIC AND ANAEROBIC  4CC   Final  . Culture  Setup Time 09/13/2014    Final                   Value:GRAM NEGATIVE RODS AEROBIC BOTTLE ONLY CRITICAL RESULT CALLED TO, READ BACK BY AND VERIFIED WITH: CATHY DUNN AT 0530 0N 10/05/14/RWW   . Culture 10/07/2014    Final                   Value:GRAM NEGATIVE RODS AEROBIC BOTTLE ONLY IDENTIFICATION TO FOLLOW   . Report Status 09/23/2014 PENDING   Incomplete    STUDIES: Dg Chest 2 View  10/03/2014   CLINICAL DATA:  Vomiting, fever and shortness of breath.  EXAM: CHEST  2 VIEW  COMPARISON:  08/20/2014  FINDINGS: There is mild cardiac enlargement. Mild asymmetric  elevation of the right hemidiaphragm is noted. Airspace opacity is identified within both lung bases. Upper lobes are clear.  IMPRESSION: 1. Bibasilar opacities noted suspicious for pneumonia.   Electronically Signed   By: Kerby Moors M.D.   On: 09/09/2014 12:07   Dg Chest Port 1 View  09/16/2014   CLINICAL DATA:  Shortness of breath and breathing difficulties that again today. History of hypertension, diabetes, CHF, chronic respiratory failure, and CML  EXAM: PORTABLE CHEST - 1 VIEW  COMPARISON:  Chest x-ray from earlier same day.  FINDINGS: Study is hypoinspiratory with low lung volumes. Coarse lung markings within the perihilar and bibasilar regions are likely commensurate to the low lung volumes, however, interstitial edema or early developing pneumonias cannot be confidently excluded. Upper lungs remain clear. No pleural effusion seen. No  pneumothorax. Mild cardiomegaly is unchanged.  IMPRESSION: Hypoinspiratory exam with low lung volumes limiting characterization of the perihilar regions and lung bases.  Given the cardiomegaly, I cannot exclude some degree of central pulmonary vascular congestion related to mild volume overload/CHF.  Similarly, the subtle bibasilar opacities described on the previous report are likely related to the low lung volumes but associated edema and/or early developing pneumonias cannot be confidently excluded.  Would consider short-term follow-up chest x-ray with better inspiratory effort.   Electronically Signed   By: Franki Cabot M.D.   On: 09/15/2014 16:28    ASSESSMENT:  CML  PLAN:   1. CML. BCR/ABL is undetectable. We will hold Bosulif at this time. Very unlikely that current symptoms of acute respiratory failure, sepsis, and acute renal failure related to Bosulif. Discussed case with Dr. Grayland Ormond. We will continue to follow patient and Dr. Grayland Ormond will round on this patient on Monday. Agree with continued aggressive treatment of sepsis and acute hypoxic  respiratory failure related to pneumonia. Blood cultures positive for gram-negative rods, currently on atreonam and Levaquin.  Dr. Grayland Ormond  was available for consultation and review of plan of care for this patient.  No matching staging information was found for the patient.  Evlyn Kanner, NP   10/05/2014 1:08 PM

## 2014-10-05 NOTE — Progress Notes (Signed)
Pt bladder scanned at 1800 due to no urine out put since 0830.  Pt had 244ml. Pt up to bedside shortly after.

## 2014-10-05 NOTE — Progress Notes (Signed)
Dr. Lavetta Nielsen notified of positive blood cultures

## 2014-10-05 NOTE — Progress Notes (Signed)
[facility]  PROGRESS NOTE Date of Admission:  09/19/2014     ID: Cassidy Gates is a 49 y.o. 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  THen developed acute respiratory hypoxic failure. Admitted to ICU    Active Problems:   Sepsis  Subjective: Still with cough, pain in back, no fevers, eating alittle more  Gram neg rods on bcx -   ROS  Eleven systems are reviewed and negative except per hpi  Medications:  Antibiotics Given (last 72 hours)    Date/Time Action Medication Dose Rate   10/05/2014 2329 Given   vancomycin (VANCOCIN) 1,250 mg in sodium chloride 0.9 % 250 mL IVPB 1,250 mg 166.7 mL/hr   10/05/14 1045 Given   levofloxacin (LEVAQUIN) IVPB 750 mg 750 mg 100 mL/hr     . bosutinib  500 mg Oral Q breakfast  . cyanocobalamin  500 mcg Oral Daily  . enoxaparin (LOVENOX) injection  40 mg Subcutaneous Q24H  . ferrous sulfate  325 mg Oral Q breakfast  . insulin aspart  0-5 Units Subcutaneous QHS  . insulin aspart  0-9 Units Subcutaneous TID WC  . levofloxacin (LEVAQUIN) IV  750 mg Intravenous Q24H  . loratadine  10 mg Oral QHS  . montelukast  10 mg Oral QHS  . pravastatin  10 mg Oral q1800  . torsemide  20 mg Oral Daily  . vancomycin  1,250 mg Intravenous Q18H  . vitamin C  1,000 mg Oral Daily    Objective: Vital signs in last 24 hours: Temp:  [98 F (36.7 C)-103.1 F (39.5 C)] 98 F (36.7 C) (08/27 0830) Pulse Rate:  [94-122] 94 (08/27 1000) Resp:  [17-65] 44 (08/27 1000) BP: (61-152)/(24-89) 97/59 mmHg (08/27 1000) SpO2:  [87 %-99 %] 96 % (08/27 1000) Weight:  [113.399 kg (250 lb)] 113.399 kg (250 lb) (08/26 1558) Physical Exam  Constitutional:  oriented to person, place, and time. appears chronically ill HENT: [place]/AT, PERRLA, no scleral icterus Mouth/Throat: Oropharynx is clear and dry . No  oropharyngeal exudate.  Cardiovascular: Normal rate, regular rhythm + S4 Pulmonary/Chest: decreased BS R base  Neck = supple, no nuchal rigidity Abdominal: Soft. Bowel sounds are normal.  exhibits no distension. There is no tenderness.  Lymphadenopathy: no cervical adenopathy. No axillary adenopathy Neurological: alert and oriented to person, place, and time.  Skin: Skin is warm and dry. No rash noted. No erythema.  Psychiatric: a normal mood and affect.  behavior is normal.   Lab Results  Recent Labs  09/29/2014 1609 10/05/14 0646  WBC 3.2* 26.0*  HGB 13.4 11.6*  HCT 42.0 36.3  NA 136 134*  K 4.1 5.2*  CL 103 105  CO2 17* 17*  BUN 25* 36*  CREATININE 1.65* 2.63*    Microbiology: Results for orders placed or performed during the hospital encounter of 09/09/2014  Blood Culture (routine x 2)     Status: None (Preliminary result)   Collection Time: 09/20/2014  4:09 PM  Result Value Ref Range Status   Specimen Description BLOOD LEFT ASSIST CONTROL  Final   Special Requests   Final    BOTTLES DRAWN AEROBIC AND ANAEROBIC  AER [place] ANA 2CC   Culture  Setup Time   Final    GRAM NEGATIVE RODS IN BOTH AEROBIC AND ANAEROBIC BOTTLES CRITICAL RESULT CALLED TO, READ BACK BY AND  VERIFIED WITH: CATHY DUNN AT 0530 ON 10/05/14 RWW    Culture   Final    GRAM NEGATIVE RODS IN BOTH AEROBIC AND ANAEROBIC BOTTLES IDENTIFICATION TO FOLLOW    Report Status PENDING  Incomplete  Blood Culture (routine x 2)     Status: None (Preliminary result)   Collection Time: 09/16/2014  4:16 PM  Result Value Ref Range Status   Specimen Description BLOOD RIGHT ASSIST CONTROL  Final   Special Requests   Final    BOTTLES DRAWN AEROBIC AND ANAEROBIC  AER [place] ANA 3CC   Culture  Setup Time   Final    GRAM NEGATIVE RODS IN BOTH AEROBIC AND ANAEROBIC BOTTLES CRITICAL RESULT CALLED TO, READ BACK BY AND VERIFIED WITH: CATHY DUNN AT 0530 ON 10/05/14 RWW    Culture   Final    GRAM NEGATIVE RODS IN BOTH AEROBIC AND  ANAEROBIC BOTTLES IDENTIFICATION TO FOLLOW    Report Status PENDING  Incomplete  MRSA PCR Screening     Status: None   Collection Time: 09/30/2014  9:23 PM  Result Value Ref Range Status   MRSA by PCR NEGATIVE NEGATIVE Final    Comment:        The GeneXpert MRSA Assay (FDA approved for NASAL specimens only), is one component of a comprehensive MRSA colonization surveillance program. It is not intended to diagnose MRSA infection nor to guide or monitor treatment for MRSA infections.      Studies/Results: Dg Chest 2 View  09/17/2014   CLINICAL DATA:  Vomiting, fever and shortness of breath.  EXAM: CHEST  2 VIEW  COMPARISON:  08/20/2014  FINDINGS: There is mild cardiac enlargement. Mild asymmetric elevation of the right hemidiaphragm is noted. Airspace opacity is identified within both lung bases. Upper lobes are clear.  IMPRESSION: 1. Bibasilar opacities noted suspicious for pneumonia.   Electronically Signed   By: Kerby Moors M.D.   On: 09/09/2014 12:07   Dg Chest Port 1 View  09/15/2014   CLINICAL DATA:  Shortness of breath and breathing difficulties that again today. History of hypertension, diabetes, CHF, chronic respiratory failure, and CML  EXAM: PORTABLE CHEST - 1 VIEW  COMPARISON:  Chest x-ray from earlier same day.  FINDINGS: Study is hypoinspiratory with low lung volumes. Coarse lung markings within the perihilar and bibasilar regions are likely commensurate to the low lung volumes, however, interstitial edema or early developing pneumonias cannot be confidently excluded. Upper lungs remain clear. No pleural effusion seen. No pneumothorax. Mild cardiomegaly is unchanged.  IMPRESSION: Hypoinspiratory exam with low lung volumes limiting characterization of the perihilar regions and lung bases.  Given the cardiomegaly, I cannot exclude some degree of central pulmonary vascular congestion related to mild volume overload/CHF.  Similarly, the subtle bibasilar opacities described on the  previous report are likely related to the low lung volumes but associated edema and/or early developing pneumonias cannot be confidently excluded.  Would consider short-term follow-up chest x-ray with better inspiratory effort.   Electronically Signed   By: Franki Cabot M.D.   On: 09/26/2014 16:28    Assessment/Plan: Gram negative sepsis due to PNA - cont atreonam and levo pending ID Stop vanco  ARF- likely from sepsis- restart IVF NS at 150 ccc Recheck in am - baseline is nml at 0.88  CML - on chemo  Cardiomegaly on CXR- priro Echo showed DD and  Elevated RSP. Has S4 Monitor fluid status  Diabetes type 2 without complication: Patient will be an slight scale insulin  and ADA diet.  Hold metformin.   Essential hypertension: Due to sepsis and acute respiratory failure with hypotension - hold her hypertensive medications and follow patient's blood pressure.   Anemia of chronic disease: Hemoglobin is at baseline. Continue ferrous sulfate.  OSA: CPAP will be ordered for the night. Thank you very much for the consult. Will follow with you.  [place], [place]   10/05/2014, 12:44 PM

## 2014-10-06 ENCOUNTER — Inpatient Hospital Stay: Payer: Medicare Other

## 2014-10-06 MED ORDER — METOPROLOL TARTRATE 50 MG PO TABS: 100.0000 mg | ORAL_TABLET | Freq: Two times a day (BID) | ORAL | Status: DC

## 2014-10-06 MED ORDER — HYDRALAZINE HCL 20 MG/ML IJ SOLN: 10.0000 mg | Freq: Once | INTRAMUSCULAR | Status: DC

## 2014-10-06 MED ORDER — METOPROLOL TARTRATE 1 MG/ML IV SOLN
5.0000 mg | Freq: Once | INTRAVENOUS | Status: AC
  Administered 2014-10-06: 5 mg via INTRAVENOUS
  Filled 2014-10-06: qty 5

## 2014-10-06 MED ORDER — LEVOFLOXACIN IN D5W 750 MG/150ML IV SOLN: 750.0000 mg | INTRAVENOUS | Status: DC

## 2014-10-06 MED ORDER — LORAZEPAM 2 MG/ML IJ SOLN
2.0000 mg | Freq: Once | INTRAMUSCULAR | Status: AC
  Administered 2014-10-06: 2 mg via INTRAVENOUS
  Filled 2014-10-06: qty 1

## 2014-10-07 ENCOUNTER — Ambulatory Visit: Payer: Medicare Other | Admitting: Family

## 2014-10-07 LAB — URINE CULTURE

## 2014-10-07 LAB — CULTURE, BLOOD (ROUTINE X 2)

## 2014-10-07 LAB — LEGIONELLA PNEUMOPHILA SEROGP 1 UR AG: L. PNEUMOPHILA SEROGP 1 UR AG: NEGATIVE

## 2014-10-08 LAB — CULTURE, BLOOD (ROUTINE X 2)

## 2014-10-10 NOTE — Progress Notes (Signed)
   October 13, 2014 0616  Clinical Encounter Type  Visited With Health care provider  Visit Type Death  Referral From Nurse  Consult/Referral To Chaplain  Chaplain paged to attend deceased family for comfort and support but family had not arrived. Husband did come later and I was paged but by the time I arrived again he had  left suddenly due to not having phone. Chaplain waited another 20 minutes for husband to return but has not returned as of yet.   [place] [phone]

## 2014-10-10 NOTE — Progress Notes (Signed)
Ativan given

## 2014-10-10 NOTE — Progress Notes (Signed)
Metoprolol given

## 2014-10-10 NOTE — Progress Notes (Signed)
Dr. Ola Spurr notified

## 2014-10-10 NOTE — Progress Notes (Addendum)
Called by RN due to rather sudden elevation in HR and BP.  Pt also became tachypneic and has increasing O2 requirement.  She was having a severe chill at the time. She was not having chest pain but did have a good deal of anxiety.  She was given percocet for pain and for the chill.  She continued to have the tachycardia and elevated Bp and was given IV metoprolol 5 mg x 1.  She was very anxious and was given ativan x 1.   On reviewing her otpt meds she was on metoprolol at high dose 200 mg as well as other antihypertensives hydralazine, torsemide, valsartan.  These had all been held at admit. She also has diastolic dysfunction on her echo prior admit and cardiomegaly on admit CXR  I suspect she is withdrawing from beta blocker and is also volume overloading given that she has been on IVF for hypotension and sepsis.  Acute PE is also a possibilty - she had a VQ scan last admission which was negative - no CT PE protocol done because of renal issues.  Stopped IVF. CXR and labs are pending. EKG is being done.

## 2014-10-10 NOTE — Progress Notes (Signed)
Patient heart rate 150-170's. ST on cardiac monitor.  Hypertensive.  Patient up in chair shivering, states she is freezing.  Temp 98.2.  Warm blankets applied.  Spoke with Dr. Ola Spurr.  Metoprolol 5mg  IV to be given and hydralazine 10mg  to be given 77mins later if blood pressure remains elevated.

## 2014-10-10 NOTE — Progress Notes (Signed)
I have met with family to discuss hospital course. Explained that patient had gram negative sepsis likely from PNA and was being treated with IV abx and fluids. She had decreased UOP and I am concerned with her diastolic dysfunction that she developed acute tachycardia and marked hypertension suggesting acute diastolic CHF with possible flash pulmonary edema.  Concern also for possible PE.  She decompensated and coded but was DNR.  I will call the family to update regarding the final identification of the Gram neg rod - Husband request I call her step Mother Vivia [place] at [phone]

## 2014-10-10 NOTE — Progress Notes (Signed)
   2014-10-25 0900  Clinical Encounter Type  Visited With Patient and family together  Visit Type Death  Referral From Nurse  Consult/Referral To Chaplain  Spiritual Encounters  Spiritual Needs Emotional;Prayer  Stress Factors  Family Stress Factors Loss  Met w/family in patient's room. Provided emotional support, grief counsel, & prayer.  Chap. Hailei Besser G. [place]

## 2014-10-10 NOTE — Progress Notes (Signed)
Heart rate 120's.  B/p improved after metoprolol.  Hydralazine held at this time.

## 2014-10-10 NOTE — Progress Notes (Signed)
Patient very anxious, trying to get OOB.  Tachycardiac and tachypnic.  Dr. Ola Spurr notified,  Ativan and Metoprolol ordered.

## 2014-10-10 NOTE — Progress Notes (Signed)
Patient remains very anxious.  Skin cool and clammy.

## 2014-10-10 NOTE — Progress Notes (Signed)
Patient without resp, asystole on monitor.  Patient is DNR.

## 2014-10-10 DEATH — deceased

## 2014-11-09 NOTE — Discharge Summary (Signed)
Physician Discharge Summary  Patient ID: Cassidy Gates MRN: [id number] DOB/AGE: Feb 20, 1965 49 y.o.  Admit date: 09/18/2014 Discharge date: 11/06/2014  Admission Diagnoses:  Discharge Diagnoses:  Active Problems:   Sepsis  Discharged Condition: Deceased   H and P Cassidy Gates is a 49 y.o. 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 Patient was seen earlier this morning in the emergency room and diagnosed with a pneumonia and subsequently was discharged home with oxygen which she wears when necessary. However she returns after leaving the emergency room with acute respiratory hypoxic failure. In the emergency room she received aztreonam and vancomycin and this morning she received Levaquin. Patient is noted have hypoxia she is 90% on 6 L nasal cannula as well as tachycardia and fever.  Hospital Course Patient was admitted to ICU and treated with IVF for hypotension and ARF as well as with vancomycin aztreonam and levofloxacin.  Her pressure remained low and her antihypertensives were held.   Blood cultures were positive for Gram negative rods.  Clinically she initially improved however in the AM of 8/28 she developed marked hypertension and tachycardia.  Her O2 sats worsened as well and she developed a severe chill.  She was treated with metoprolol for the hypertension and percocet for the pain and chill.  Hydralazine was ordered if BP remained elevated but was not needed.  IV fluids were stopped, CXR, EKG and labs were ordered however patient developed cardiac and respiratory failure.  However she was DNR therefore she was not coded or intubated.   Cause of death Gram negative sepsis  Consults: Oncology  Significant Diagnostic Studies:  8.26/16  CLINICAL DATA: Vomiting, fever and shortness of breath.  EXAM: CHEST 2 VIEW  COMPARISON:  08/20/2014  FINDINGS: There is mild cardiac enlargement. Mild asymmetric elevation of the right hemidiaphragm is noted. Airspace opacity is identified within both lung bases. Upper lobes are clear.  IMPRESSION: 1. Bibasilar opacities noted suspicious for pneumonia.   CXR 10-20-14  CLINICAL DATA: Respiratory distress, shortness of breath. History of CML, diabetes, hypertension, CHF.  EXAM: PORTABLE CHEST - 1 VIEW  COMPARISON: Chest radiograph October 04, 2014 move  FINDINGS: The cardiac silhouette is moderately enlarged L1 unchanged. Similar pulmonary vascular congestion, increasing RIGHT lower lobe patchy airspace opacity with small RIGHT pleural effusion. No pneumothorax. Large body habitus. Osseous structures are unchanged.  IMPRESSION: Stable cardiomegaly and pulmonary vascular congestion. Increasing RIGHT lower lobe airspace opacities most consistent with confluent edema, small RIGHT pleural effusion.  Treatments: IV hydration, antibiotics:and respiratory therapy:   Discharge Exam: Blood pressure 129/115, pulse 68, temperature 98.1 F (36.7 C), temperature source Oral, resp. rate 52, height 5\' 5"  (1.651 m), weight 113.399 kg (250 lb), SpO2 91 %. Deceased  Disposition: 20-Expired     Medication List    ASK your doctor about these medications        BOSULIF 500 MG tablet  Generic drug:  bosutinib  Take 500 mg by mouth daily with breakfast.     chlorpheniramine-HYDROcodone 10-8 MG/5ML [place]  Commonly known as:  TUSSIONEX PENNKINETIC ER  Take 5 mLs by mouth 2 (two) times daily.     docusate sodium 100 MG capsule  Commonly known as:  COLACE  Take 100 mg by mouth daily as needed for mild constipation.     ferrous sulfate 325 (65 FE) MG tablet  Take 325 mg by mouth  daily with breakfast.     hydrALAZINE 50 MG tablet  Commonly known as:  APRESOLINE  Take 50 mg by mouth 2 (two) times daily.     levocetirizine 5 MG tablet  Commonly known as:  XYZAL   Take 5 mg by mouth every evening.     levofloxacin 750 MG tablet  Commonly known as:  LEVAQUIN  Take 1 tablet (750 mg total) by mouth daily.     lovastatin 40 MG tablet  Commonly known as:  MEVACOR  Take 40 mg by mouth at bedtime.     metFORMIN 500 MG tablet  Commonly known as:  GLUCOPHAGE  Take 500 mg by mouth every evening.     metoprolol 200 MG 24 hr tablet  Commonly known as:  TOPROL-XL  Take 200 mg by mouth daily.     montelukast 10 MG tablet  Commonly known as:  SINGULAIR  Take 10 mg by mouth at bedtime.     potassium chloride SA 20 MEQ tablet  Commonly known as:  K-DUR,KLOR-CON  Take 1 tablet (20 mEq total) by mouth 2 (two) times daily.     ranitidine 150 MG tablet  Commonly known as:  ZANTAC  Take 150 mg by mouth 2 (two) times daily.     senna 8.6 MG Tabs tablet  Commonly known as:  SENOKOT  Take 1 tablet by mouth daily as needed for mild constipation.     torsemide 20 MG tablet  Commonly known as:  DEMADEX  Take 20 mg by mouth daily.     valsartan 320 MG tablet  Commonly known as:  DIOVAN  Take 320 mg by mouth daily.     vitamin B-12 500 MCG tablet  Commonly known as:  CYANOCOBALAMIN  Take 500 mcg by mouth daily.     vitamin C 500 MG tablet  Commonly known as:  ASCORBIC ACID  Take 1,000 mg by mouth daily.     zolpidem 10 MG tablet  Commonly known as:  AMBIEN  Take 10 mg by mouth at bedtime as needed for sleep.         Signed: [place], [place] 11/06/2014, 10:50 AM

## 2014-11-27 ENCOUNTER — Ambulatory Visit: Payer: Medicare Other | Admitting: Oncology

## 2014-11-27 ENCOUNTER — Other Ambulatory Visit: Payer: Medicare Other

## 2016-07-21 IMAGING — NM NM PULMONARY VENT & PERF
2 series · 16 of 16 positions shown · non-contrast
Comparison: Chest x-ray 08/20/2014

CLINICAL DATA: Short of breath

EXAM:
NUCLEAR MEDICINE VENTILATION - PERFUSION LUNG SCAN
TECHNIQUE: Ventilation images were obtained in multiple projections using
inhaled aerosol Sc-77m DTPA. Perfusion images were obtained in
multiple projections after intravenous injection of Sc-77m MAA.
RADIOPHARMACEUTICALS:  42.8 mCi Mechnetium-LLm DTPA aerosol
inhalation and 4.31 mCi Mechnetium-LLm MAA IV

[Series 1000: lung perfusion · 1.95mm/px · 4 acquisitions, 8 frames shown]
[im 1/4]
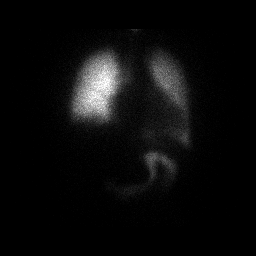
[im 1/4]
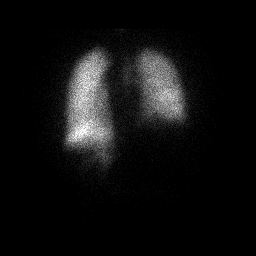
[im 2/4]
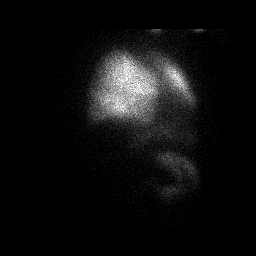
[im 2/4]
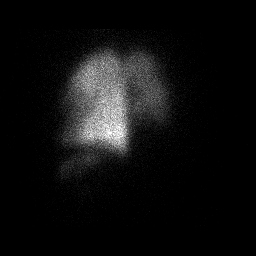
[im 3/4]
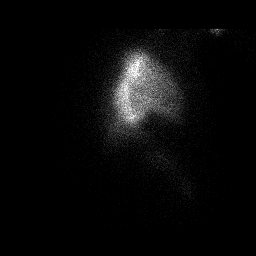
[im 3/4]
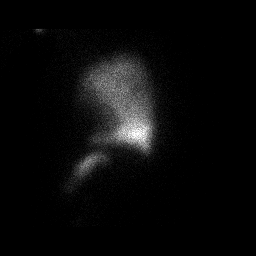
[im 4/4]
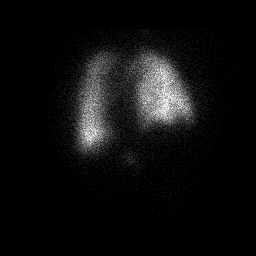
[im 4/4]
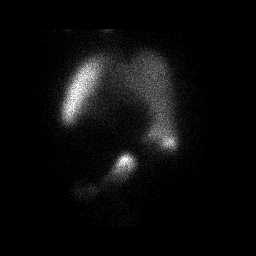

[Series 1000: lung ventilation · 3.90mm/px · 4 acquisitions, 8 frames shown]
[im 1/4]
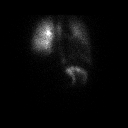
[im 1/4]
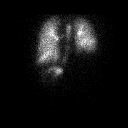
[im 2/4]
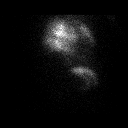
[im 2/4]
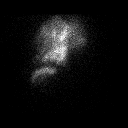
[im 3/4]
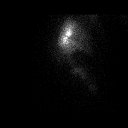
[im 3/4]
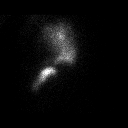
[im 4/4]
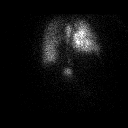
[im 4/4]
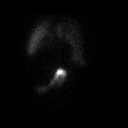

[16 of 16 positions shown; findings below may reference images not displayed]

FINDINGS: Ventilation: Elevated right hemidiaphragm is noted on chest x-ray
with mild right lower lobe atelectasis in diffusion. No segmental
perfusion defects are identified. Overall perfusion as fever defects
and ventilation.

Perfusion: Elevated right hemidiaphragm. Patchy ventilation
bilaterally with some DTPA aggregation.
IMPRESSION: Low probability for pulmonary emboli.  Elevated right hemidiaphragm.

## 2016-09-03 IMAGING — CR DG CHEST 1V PORT
1 series · 1 of 1 positions shown · non-contrast
Comparison: Chest x-ray from earlier same day.

CLINICAL DATA: Shortness of breath and breathing difficulties that
again today. History of hypertension, diabetes, CHF, chronic
respiratory failure, and CML

EXAM:
PORTABLE CHEST - 1 VIEW

[ap]
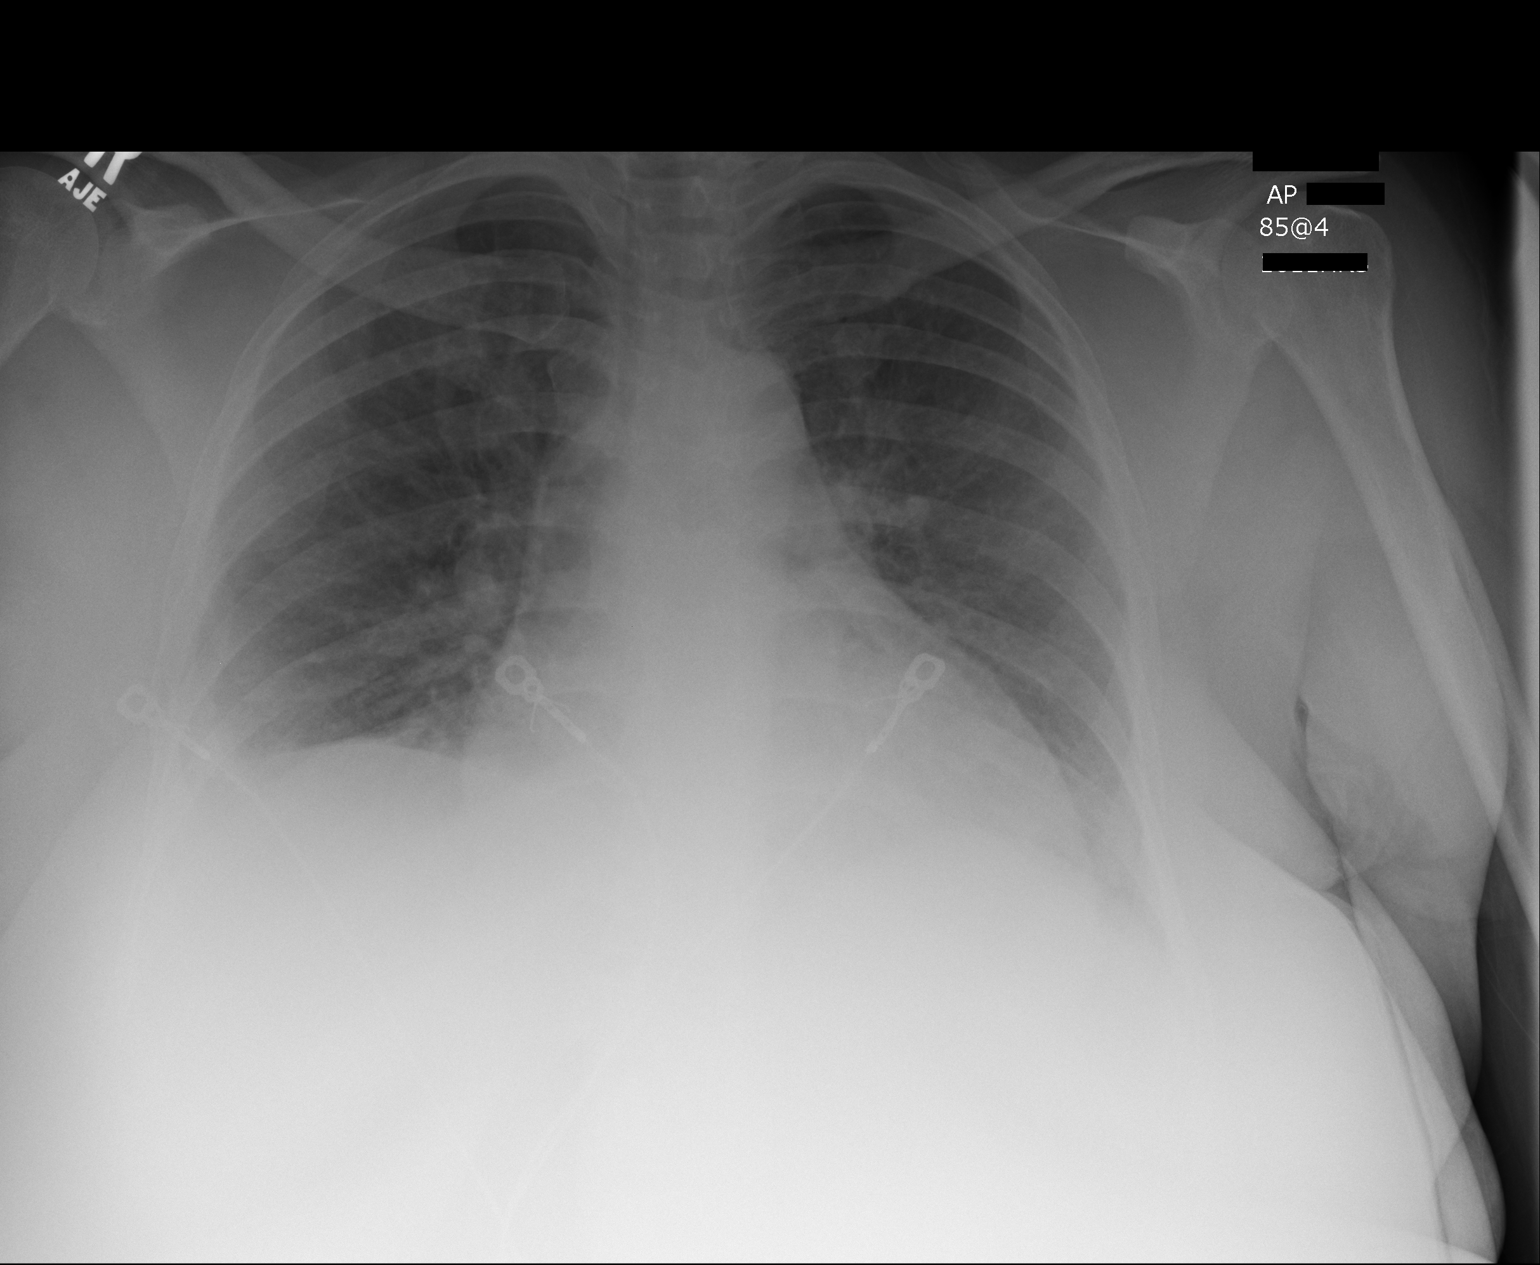

[1 of 1 positions shown; findings below may reference images not displayed]

FINDINGS: Study is hypoinspiratory with low lung volumes. Coarse lung markings
within the perihilar and bibasilar regions are likely commensurate
to the low lung volumes, however, interstitial edema or early
developing pneumonias cannot be confidently excluded. Upper lungs
remain clear. No pleural effusion seen. No pneumothorax. Mild
cardiomegaly is unchanged.
IMPRESSION: Hypoinspiratory exam with low lung volumes limiting characterization
of the perihilar regions and lung bases.

Given the cardiomegaly, I cannot exclude some degree of central
pulmonary vascular congestion related to mild volume overload/CHF.

Similarly, the subtle bibasilar opacities described on the previous
report are likely related to the low lung volumes but associated
edema and/or early developing pneumonias cannot be confidently
excluded.

Would consider short-term follow-up chest x-ray with better
inspiratory effort.

## 2016-09-03 IMAGING — CR DG CHEST 2V
2 series · 2 of 2 positions shown · non-contrast
Comparison: 08/20/2014

CLINICAL DATA: Vomiting, fever and shortness of breath.

EXAM:
CHEST  2 VIEW

[chest lat]
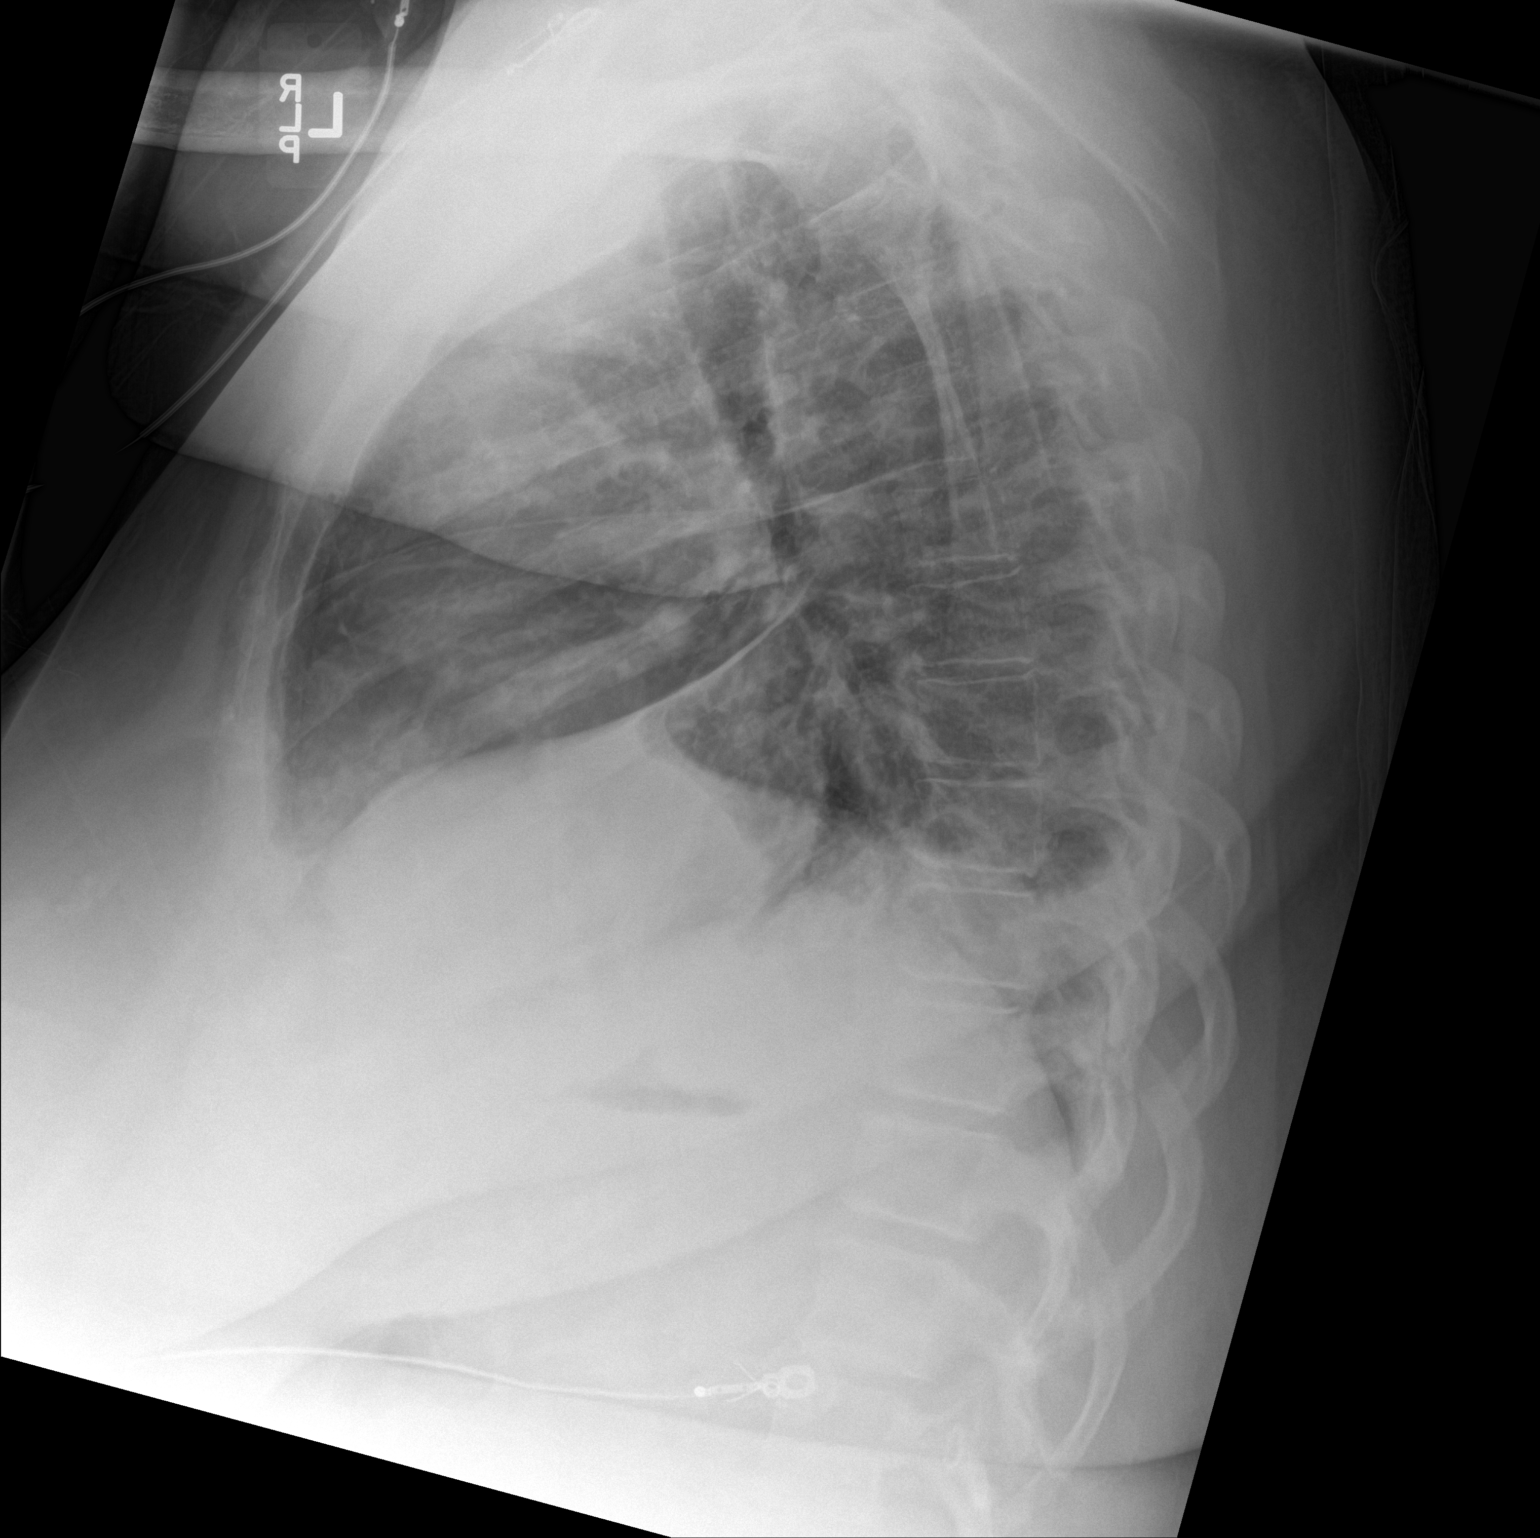

[chest ap]
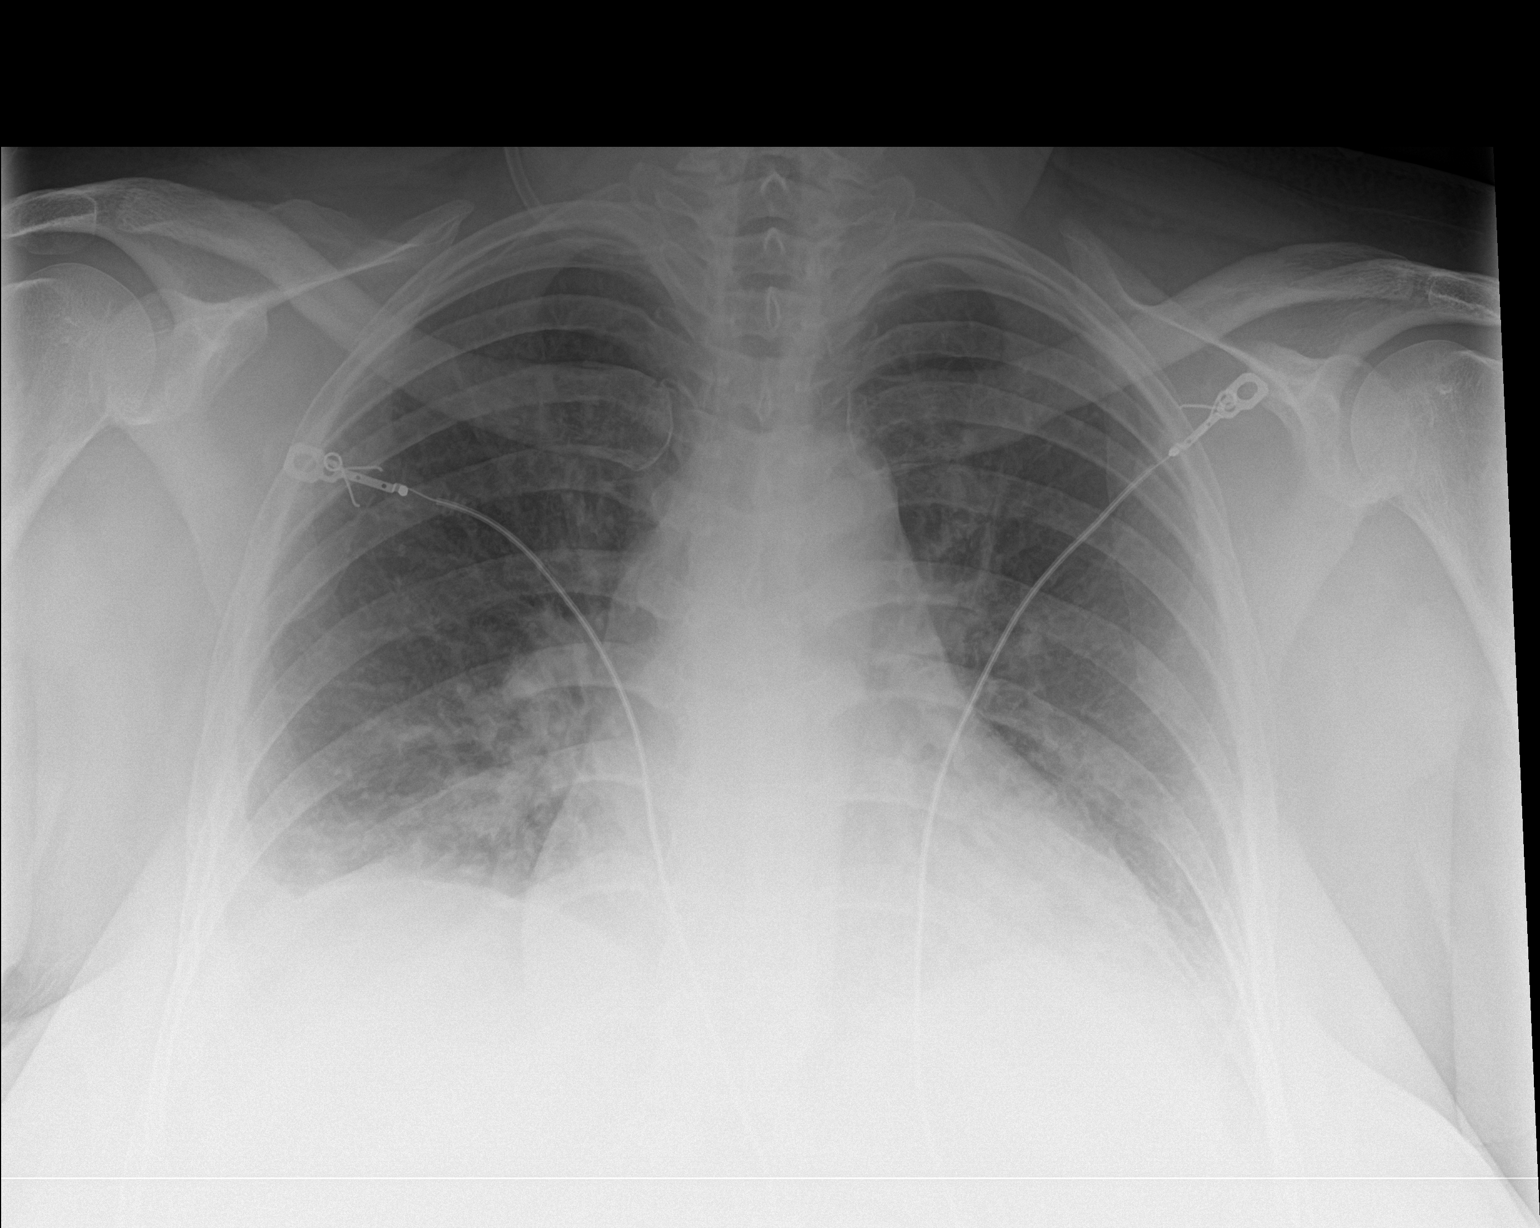

[2 of 2 positions shown; findings below may reference images not displayed]

FINDINGS: There is mild cardiac enlargement. Mild asymmetric elevation of the
right hemidiaphragm is noted. Airspace opacity is identified within
both lung bases. Upper lobes are clear.
IMPRESSION: 1. Bibasilar opacities noted suspicious for pneumonia.
